# Patient Record
Sex: Female | Born: 1945 | Race: White | Hispanic: No | Smoking: Never smoker
Health system: Southern US, Community
[De-identification: ages and names within clinical notes are randomized; demographics above are authoritative.]

## PROBLEM LIST (undated history)

## (undated) DIAGNOSIS — Z8719 Personal history of other diseases of the digestive system: Secondary | ICD-10-CM

## (undated) DIAGNOSIS — C801 Malignant (primary) neoplasm, unspecified: Secondary | ICD-10-CM

## (undated) DIAGNOSIS — K219 Gastro-esophageal reflux disease without esophagitis: Secondary | ICD-10-CM

## (undated) DIAGNOSIS — F32A Depression, unspecified: Secondary | ICD-10-CM

## (undated) DIAGNOSIS — F419 Anxiety disorder, unspecified: Secondary | ICD-10-CM

## (undated) DIAGNOSIS — M1990 Unspecified osteoarthritis, unspecified site: Secondary | ICD-10-CM

## (undated) DIAGNOSIS — E785 Hyperlipidemia, unspecified: Secondary | ICD-10-CM

## (undated) DIAGNOSIS — F40240 Claustrophobia: Secondary | ICD-10-CM

## (undated) DIAGNOSIS — I10 Essential (primary) hypertension: Secondary | ICD-10-CM

## (undated) HISTORY — PX: HERNIA REPAIR: SHX51

## (undated) HISTORY — PX: COLONOSCOPY: SHX174

## (undated) HISTORY — PX: TONSILLECTOMY AND ADENOIDECTOMY: SHX28

## (undated) HISTORY — PX: COLON SURGERY: SHX602

## (undated) HISTORY — PX: TONSILLECTOMY: SUR1361

## (undated) SURGERY — Surgical Case
Anesthesia: *Unknown

---

## 2004-05-31 ENCOUNTER — Ambulatory Visit: Payer: Self-pay

## 2005-05-29 ENCOUNTER — Ambulatory Visit: Payer: Self-pay

## 2005-07-17 ENCOUNTER — Emergency Department: Payer: Self-pay | Admitting: Emergency Medicine

## 2006-07-03 ENCOUNTER — Ambulatory Visit: Payer: Self-pay

## 2009-03-11 HISTORY — PX: ABDOMINAL HYSTERECTOMY: SHX81

## 2010-03-11 HISTORY — PX: CHOLECYSTECTOMY: SHX55

## 2013-10-09 DIAGNOSIS — C20 Malignant neoplasm of rectum: Secondary | ICD-10-CM

## 2013-10-09 HISTORY — DX: Malignant neoplasm of rectum: C20

## 2013-10-14 DIAGNOSIS — R195 Other fecal abnormalities: Secondary | ICD-10-CM | POA: Insufficient documentation

## 2013-11-01 ENCOUNTER — Ambulatory Visit: Payer: Self-pay | Admitting: Gastroenterology

## 2013-11-01 DIAGNOSIS — K5790 Diverticulosis of intestine, part unspecified, without perforation or abscess without bleeding: Secondary | ICD-10-CM

## 2013-11-01 DIAGNOSIS — C801 Malignant (primary) neoplasm, unspecified: Secondary | ICD-10-CM

## 2013-11-01 DIAGNOSIS — K2960 Other gastritis without bleeding: Secondary | ICD-10-CM

## 2013-11-01 HISTORY — PX: ESOPHAGOGASTRODUODENOSCOPY: SHX1529

## 2013-11-01 HISTORY — DX: Other gastritis without bleeding: K29.60

## 2013-11-01 HISTORY — DX: Malignant (primary) neoplasm, unspecified: C80.1

## 2013-11-01 HISTORY — DX: Diverticulosis of intestine, part unspecified, without perforation or abscess without bleeding: K57.90

## 2013-11-03 LAB — PATHOLOGY REPORT

## 2013-11-09 HISTORY — PX: RECTAL ULTRASOUND: SHX2306

## 2013-11-18 DIAGNOSIS — C20 Malignant neoplasm of rectum: Secondary | ICD-10-CM | POA: Insufficient documentation

## 2013-12-28 HISTORY — PX: UMBILICAL HERNIA REPAIR: SHX196

## 2013-12-28 HISTORY — PX: PROCTECTOMY W/ CREATION OF COLON RESERVOIR: SUR1050

## 2014-01-14 ENCOUNTER — Emergency Department: Payer: Self-pay | Admitting: Emergency Medicine

## 2014-01-14 LAB — CBC WITH DIFFERENTIAL/PLATELET
BASOS ABS: 0.1 10*3/uL (ref 0.0–0.1)
BASOS PCT: 0.3 %
Eosinophil #: 0.1 10*3/uL (ref 0.0–0.7)
Eosinophil %: 0.4 %
HCT: 38.5 % (ref 35.0–47.0)
HGB: 13.2 g/dL (ref 12.0–16.0)
LYMPHS ABS: 1.4 10*3/uL (ref 1.0–3.6)
LYMPHS PCT: 8.1 %
MCH: 31.8 pg (ref 26.0–34.0)
MCHC: 34.3 g/dL (ref 32.0–36.0)
MCV: 93 fL (ref 80–100)
Monocyte #: 1.7 x10 3/mm — ABNORMAL HIGH (ref 0.2–0.9)
Monocyte %: 9.8 %
Neutrophil #: 14.5 10*3/uL — ABNORMAL HIGH (ref 1.4–6.5)
Neutrophil %: 81.4 %
Platelet: 542 10*3/uL — ABNORMAL HIGH (ref 150–440)
RBC: 4.15 10*6/uL (ref 3.80–5.20)
RDW: 12.9 % (ref 11.5–14.5)
WBC: 17.8 10*3/uL — ABNORMAL HIGH (ref 3.6–11.0)

## 2014-01-14 LAB — COMPREHENSIVE METABOLIC PANEL
ALBUMIN: 3 g/dL — AB (ref 3.4–5.0)
ANION GAP: 7 (ref 7–16)
AST: 18 U/L (ref 15–37)
Alkaline Phosphatase: 77 U/L
BILIRUBIN TOTAL: 0.4 mg/dL (ref 0.2–1.0)
BUN: 23 mg/dL — AB (ref 7–18)
Calcium, Total: 8.7 mg/dL (ref 8.5–10.1)
Chloride: 108 mmol/L — ABNORMAL HIGH (ref 98–107)
Co2: 25 mmol/L (ref 21–32)
Creatinine: 1.15 mg/dL (ref 0.60–1.30)
EGFR (Non-African Amer.): 50 — ABNORMAL LOW
GLUCOSE: 112 mg/dL — AB (ref 65–99)
Osmolality: 284 (ref 275–301)
POTASSIUM: 4 mmol/L (ref 3.5–5.1)
SGPT (ALT): 24 U/L
Sodium: 140 mmol/L (ref 136–145)
Total Protein: 7.7 g/dL (ref 6.4–8.2)

## 2014-01-14 LAB — URINALYSIS, COMPLETE
Bilirubin,UR: NEGATIVE
Blood: NEGATIVE
Glucose,UR: NEGATIVE mg/dL (ref 0–75)
Ketone: NEGATIVE
Leukocyte Esterase: NEGATIVE
NITRITE: NEGATIVE
PROTEIN: NEGATIVE
Ph: 5 (ref 4.5–8.0)
Specific Gravity: 1.021 (ref 1.003–1.030)
Squamous Epithelial: 10
WBC UR: 5 /HPF (ref 0–5)

## 2014-01-16 DIAGNOSIS — R188 Other ascites: Secondary | ICD-10-CM | POA: Insufficient documentation

## 2014-01-19 LAB — CULTURE, BLOOD (SINGLE)

## 2014-08-11 DIAGNOSIS — K594 Anal spasm: Secondary | ICD-10-CM | POA: Insufficient documentation

## 2014-09-13 DIAGNOSIS — K439 Ventral hernia without obstruction or gangrene: Secondary | ICD-10-CM | POA: Insufficient documentation

## 2015-04-05 ENCOUNTER — Other Ambulatory Visit: Payer: Self-pay | Admitting: Family Medicine

## 2015-04-05 DIAGNOSIS — Z1231 Encounter for screening mammogram for malignant neoplasm of breast: Secondary | ICD-10-CM

## 2015-05-08 HISTORY — PX: FLEXIBLE SIGMOIDOSCOPY: SHX1649

## 2016-08-01 ENCOUNTER — Other Ambulatory Visit: Payer: Self-pay | Admitting: Family Medicine

## 2016-08-01 DIAGNOSIS — Z1231 Encounter for screening mammogram for malignant neoplasm of breast: Secondary | ICD-10-CM

## 2016-08-16 ENCOUNTER — Encounter (HOSPITAL_COMMUNITY): Payer: Self-pay

## 2016-08-16 ENCOUNTER — Ambulatory Visit
Admission: RE | Admit: 2016-08-16 | Discharge: 2016-08-16 | Disposition: A | Discharge status: Home / Self Care | Payer: Medicare HMO | Source: Ambulatory Visit | Attending: Family Medicine | Admitting: Family Medicine

## 2016-08-16 DIAGNOSIS — Z1231 Encounter for screening mammogram for malignant neoplasm of breast: Secondary | ICD-10-CM | POA: Insufficient documentation

## 2016-08-16 HISTORY — DX: Malignant (primary) neoplasm, unspecified: C80.1

## 2017-07-07 ENCOUNTER — Other Ambulatory Visit: Payer: Self-pay | Admitting: Family Medicine

## 2017-07-07 DIAGNOSIS — Z1231 Encounter for screening mammogram for malignant neoplasm of breast: Secondary | ICD-10-CM

## 2017-09-02 ENCOUNTER — Ambulatory Visit
Admission: RE | Admit: 2017-09-02 | Discharge: 2017-09-02 | Disposition: A | Discharge status: Home / Self Care | Payer: Medicare HMO | Source: Ambulatory Visit | Attending: Family Medicine | Admitting: Family Medicine

## 2017-09-02 DIAGNOSIS — Z1231 Encounter for screening mammogram for malignant neoplasm of breast: Secondary | ICD-10-CM | POA: Diagnosis not present

## 2017-12-15 ENCOUNTER — Other Ambulatory Visit: Payer: Self-pay | Admitting: General Surgery

## 2017-12-15 DIAGNOSIS — Z9889 Other specified postprocedural states: Secondary | ICD-10-CM

## 2017-12-15 DIAGNOSIS — Z8719 Personal history of other diseases of the digestive system: Secondary | ICD-10-CM

## 2017-12-15 DIAGNOSIS — R109 Unspecified abdominal pain: Secondary | ICD-10-CM

## 2017-12-19 ENCOUNTER — Ambulatory Visit
Admission: RE | Admit: 2017-12-19 | Discharge: 2017-12-19 | Disposition: A | Discharge status: Home / Self Care | Payer: Medicare HMO | Source: Ambulatory Visit | Attending: General Surgery | Admitting: General Surgery

## 2017-12-24 ENCOUNTER — Other Ambulatory Visit
Admission: RE | Admit: 2017-12-24 | Discharge: 2017-12-24 | Disposition: A | Discharge status: Home / Self Care | Payer: Medicare HMO | Source: Ambulatory Visit | Attending: General Surgery | Admitting: General Surgery

## 2017-12-24 ENCOUNTER — Ambulatory Visit
Admission: RE | Admit: 2017-12-24 | Discharge: 2017-12-24 | Disposition: A | Discharge status: Home / Self Care | Payer: Medicare HMO | Source: Ambulatory Visit | Attending: General Surgery | Admitting: General Surgery

## 2017-12-24 DIAGNOSIS — Z9889 Other specified postprocedural states: Secondary | ICD-10-CM | POA: Insufficient documentation

## 2017-12-24 DIAGNOSIS — R109 Unspecified abdominal pain: Secondary | ICD-10-CM | POA: Insufficient documentation

## 2017-12-24 DIAGNOSIS — K6819 Other retroperitoneal abscess: Secondary | ICD-10-CM | POA: Diagnosis not present

## 2017-12-24 DIAGNOSIS — Z8719 Personal history of other diseases of the digestive system: Secondary | ICD-10-CM

## 2017-12-24 HISTORY — DX: Essential (primary) hypertension: I10

## 2017-12-24 LAB — CREATININE, SERUM: CREATININE: 0.89 mg/dL (ref 0.44–1.00)

## 2017-12-24 MED ORDER — IOPAMIDOL (ISOVUE-300) INJECTION 61%
100.0000 mL | Freq: Once | INTRAVENOUS | Status: AC | PRN
  Administered 2017-12-24: 100 mL via INTRAVENOUS

## 2019-06-09 ENCOUNTER — Other Ambulatory Visit: Payer: Self-pay | Admitting: Family Medicine

## 2019-06-09 DIAGNOSIS — Z1231 Encounter for screening mammogram for malignant neoplasm of breast: Secondary | ICD-10-CM

## 2019-06-09 DIAGNOSIS — Z780 Asymptomatic menopausal state: Secondary | ICD-10-CM

## 2019-07-20 ENCOUNTER — Ambulatory Visit
Admission: RE | Admit: 2019-07-20 | Discharge: 2019-07-20 | Disposition: A | Discharge status: Home / Self Care | Payer: Medicare HMO | Source: Ambulatory Visit | Attending: Family Medicine | Admitting: Family Medicine

## 2019-07-20 DIAGNOSIS — Z780 Asymptomatic menopausal state: Secondary | ICD-10-CM | POA: Insufficient documentation

## 2019-07-20 DIAGNOSIS — Z1231 Encounter for screening mammogram for malignant neoplasm of breast: Secondary | ICD-10-CM | POA: Diagnosis present

## 2019-11-11 ENCOUNTER — Ambulatory Visit (INDEPENDENT_AMBULATORY_CARE_PROVIDER_SITE_OTHER): Payer: Medicare HMO | Admitting: Vascular Surgery

## 2019-11-11 ENCOUNTER — Other Ambulatory Visit: Payer: Self-pay

## 2019-11-11 ENCOUNTER — Encounter (INDEPENDENT_AMBULATORY_CARE_PROVIDER_SITE_OTHER): Payer: Self-pay | Admitting: Vascular Surgery

## 2019-11-11 DIAGNOSIS — E782 Mixed hyperlipidemia: Secondary | ICD-10-CM

## 2019-11-11 DIAGNOSIS — I8390 Asymptomatic varicose veins of unspecified lower extremity: Secondary | ICD-10-CM | POA: Insufficient documentation

## 2019-11-11 DIAGNOSIS — I872 Venous insufficiency (chronic) (peripheral): Secondary | ICD-10-CM

## 2019-11-11 DIAGNOSIS — M79601 Pain in right arm: Secondary | ICD-10-CM | POA: Diagnosis not present

## 2019-11-11 DIAGNOSIS — E785 Hyperlipidemia, unspecified: Secondary | ICD-10-CM | POA: Insufficient documentation

## 2019-11-11 DIAGNOSIS — I8391 Asymptomatic varicose veins of right lower extremity: Secondary | ICD-10-CM | POA: Diagnosis not present

## 2019-11-11 NOTE — Progress Notes (Signed)
MRN : [id number]  Destiny Durham is a 74 y.o. (30-Jul-1945) female who presents with chief complaint of  Chief Complaint  Patient presents with  . New Patient (Initial Visit)    ref Bliss pain with upper right arm  .  History of Present Illness:   Chief complaint: Right arm/shoulder pain  Location: right shoulder Character/quality of the symptom:  Sharp point specific pain Severity:  intense Duration:  intermittent Timing/onset:  Acute while she was moving heavy furniture Aggravating/context:  Raising her arm; with movement Relieving/modifying:  Rest cold compresses   Current Meds  Medication Sig  . diazepam (VALIUM) 10 MG tablet   . gabapentin (NEURONTIN) 300 MG capsule Take by mouth.  Marland Kitchen lisinopril-hydrochlorothiazide (ZESTORETIC) 20-12.5 MG tablet   . Multiple Vitamins-Minerals (ONE-A-DAY VITACRAVES) CHEW daily.  . naproxen sodium (ALEVE) 220 MG tablet Take by mouth.  . pantoprazole (PROTONIX) 40 MG tablet Take by mouth.  . pravastatin (PRAVACHOL) 20 MG tablet   . sertraline (ZOLOFT) 50 MG tablet Take by mouth.    Past Medical History:  Diagnosis Date  . Cancer ([place])    rectal  . Hypertension     Past Surgical History:  Procedure Laterality Date  . ABDOMINAL HYSTERECTOMY    . CHOLECYSTECTOMY    . COLON SURGERY    . TONSILLECTOMY AND ADENOIDECTOMY      Social History Social History   Tobacco Use  . Smoking status: Never Smoker  . Smokeless tobacco: Never Used  Substance Use Topics  . Alcohol use: Not Currently  . Drug use: Never    Family History Family History  Problem Relation Age of Onset  . Hypertension Mother   . Kidney cancer Sister   . Heart attack Maternal Grandmother   . Breast cancer Neg Hx   No family history of bleeding/clotting disorders, porphyria or autoimmune disease   Allergies  Allergen Reactions  . Penicillins Hives  . Latex Rash     REVIEW OF SYSTEMS (Negative unless checked)  Constitutional: [] Weight loss   [] Fever  [] Chills Cardiac: [] Chest pain   [] Chest pressure   [] Palpitations   [] Shortness of breath when laying flat   [] Shortness of breath with exertion. Vascular:  [] Pain in legs with walking   [] Pain in legs at rest  [] History of DVT   [] Phlebitis   [] Swelling in legs   [] Varicose veins   [] Non-healing ulcers Pulmonary:   [] Uses home oxygen   [] Productive cough   [] Hemoptysis   [] Wheeze  [] COPD   [] Asthma Neurologic:  [] Dizziness   [] Seizures   [] History of stroke   [] History of TIA  [] Aphasia   [] Vissual changes   [] Weakness or numbness in arm   [] Weakness or numbness in leg Musculoskeletal:   [] Joint swelling   [x] Joint pain   [] Low back pain Hematologic:  [] Easy bruising  [] Easy bleeding   [] Hypercoagulable state   [] Anemic Gastrointestinal:  [] Diarrhea   [] Vomiting  [] Gastroesophageal reflux/heartburn   [] Difficulty swallowing. Genitourinary:  [] Chronic kidney disease   [] Difficult urination  [] Frequent urination   [] Blood in urine Skin:  [] Rashes   [] Ulcers  Psychological:  [] History of anxiety   []  History of major depression.  Physical Examination  Vitals:   11/11/19 1320  BP: 122/71  Pulse: 73  Resp: 16  Weight: 183 lb (83 kg)  Height: 5\' 3"  (1.6 m)   Body mass index is 32.42 kg/m. Gen: WD/WN, NAD Head: [place]/AT, No temporalis wasting.  Ear/Nose/Throat: Hearing grossly intact, nares w/o erythema  or drainage, poor dentition Eyes: PER, EOMI, sclera nonicteric.  Neck: Supple, no masses.  No bruit or JVD.  Pulmonary:  Good air movement, clear to auscultation bilaterally, no use of accessory muscles.  Cardiac: RRR, normal S1, S2, no Murmurs. Vascular: Large varicosities present extensively greater than 10 mm right calf.  Mild venous stasis changes to the legs bilaterally.  trace soft pitting edema Vessel Right Left  Radial Palpable Palpable  Brachial Palpable Palpable  PT Palpable   DP Palpable   Gastrointestinal: soft, non-distended. No guarding/no peritoneal signs.   Musculoskeletal: M/S 5/5 throughout.  No deformity or atrophy.  Neurologic: CN 2-12 intact. Pain and light touch intact in extremities.  Symmetrical.  Speech is fluent. Motor exam as listed above. Psychiatric: Judgment intact, Mood & affect appropriate for pt's clinical situation. Dermatologic: No rashes or ulcers noted.  No changes consistent with cellulitis.  CBC Lab Results  Component Value Date   WBC 17.8 (H) 01/14/2014   HGB 13.2 01/14/2014   HCT 38.5 01/14/2014   MCV 93 01/14/2014   PLT 542 (H) 01/14/2014    BMET    Component Value Date/Time   NA 140 01/14/2014 1730   K 4.0 01/14/2014 1730   CL 108 (H) 01/14/2014 1730   CO2 25 01/14/2014 1730   GLUCOSE 112 (H) 01/14/2014 1730   BUN 23 (H) 01/14/2014 1730   CREATININE 0.89 12/24/2017 1121   CREATININE 1.15 01/14/2014 1730   CALCIUM 8.7 01/14/2014 1730   GFRNONAA >60 12/24/2017 1121   GFRNONAA 50 (L) 01/14/2014 1730   GFRAA >60 12/24/2017 1121   GFRAA >60 01/14/2014 1730   CrCl cannot be calculated (Patient's most recent lab result is older than the maximum 21 days allowed.).  COAG No results found for: INR, PROTIME  Radiology No results found.    Assessment/Plan 1. Right arm pain I suspect the patient has a shoulder injury from moving heavy furniture.  - Ambulatory referral to Orthopedic Surgery  2. Varicose veins of right lower extremity, unspecified whether complicated Recommend:  The patient is complaining of varicose veins.    I have had a long discussion with the patient regarding  varicose veins and why they cause symptoms.  Patient will begin wearing graduated compression stockings on a daily basis, beginning first thing in the morning and removing them in the evening. The patient is instructed specifically not to sleep in the stockings.    In addition, behavioral modification including elevation during the day will be initiated, utilizing a recliner was recommended.  The patient is also instructed  to continue exercising such as walking 4-5 times per week.  At this time the patient wishes to continue conservative therapy and is not interested in more invasive treatments such as laser ablation and sclerotherapy.  The Patient will follow up PRN if the symptoms worsen.  3. Chronic venous insufficiency Recommend:  The patient is complaining of varicose veins.    I have had a long discussion with the patient regarding  varicose veins and why they cause symptoms.  Patient will begin wearing graduated compression stockings on a daily basis, beginning first thing in the morning and removing them in the evening. The patient is instructed specifically not to sleep in the stockings.    In addition, behavioral modification including elevation during the day will be initiated, utilizing a recliner was recommended.  The patient is also instructed to continue exercising such as walking 4-5 times per week.  At this time the patient wishes to  continue conservative therapy and is not interested in more invasive treatments such as laser ablation and sclerotherapy.  The Patient will follow up PRN if the symptoms worsen.  4. Mixed hyperlipidemia Continue statin as ordered and reviewed, no changes at this time     Hortencia Pilar, MD  11/11/2019 2:13 PM

## 2019-11-29 ENCOUNTER — Other Ambulatory Visit: Payer: Self-pay | Admitting: Surgery

## 2019-11-29 ENCOUNTER — Other Ambulatory Visit (HOSPITAL_COMMUNITY): Payer: Self-pay | Admitting: Surgery

## 2019-11-29 DIAGNOSIS — M7581 Other shoulder lesions, right shoulder: Secondary | ICD-10-CM

## 2019-12-16 ENCOUNTER — Other Ambulatory Visit: Payer: Self-pay

## 2019-12-16 ENCOUNTER — Ambulatory Visit
Admission: RE | Admit: 2019-12-16 | Discharge: 2019-12-16 | Disposition: A | Discharge status: Home / Self Care | Payer: Medicare HMO | Source: Ambulatory Visit | Attending: Surgery | Admitting: Surgery

## 2019-12-16 DIAGNOSIS — M7581 Other shoulder lesions, right shoulder: Secondary | ICD-10-CM | POA: Insufficient documentation

## 2020-01-14 ENCOUNTER — Other Ambulatory Visit: Payer: Self-pay | Admitting: Surgery

## 2020-01-31 ENCOUNTER — Other Ambulatory Visit: Payer: Self-pay

## 2020-01-31 ENCOUNTER — Encounter
Admission: RE | Admit: 2020-01-31 | Discharge: 2020-01-31 | Disposition: A | Discharge status: Home / Self Care | Payer: Medicare HMO | Source: Ambulatory Visit | Attending: Surgery | Admitting: Surgery

## 2020-01-31 DIAGNOSIS — I10 Essential (primary) hypertension: Secondary | ICD-10-CM | POA: Diagnosis not present

## 2020-01-31 DIAGNOSIS — Z01818 Encounter for other preprocedural examination: Secondary | ICD-10-CM | POA: Diagnosis not present

## 2020-01-31 HISTORY — DX: Gastro-esophageal reflux disease without esophagitis: K21.9

## 2020-01-31 HISTORY — DX: Hyperlipidemia, unspecified: E78.5

## 2020-01-31 HISTORY — DX: Anxiety disorder, unspecified: F41.9

## 2020-01-31 HISTORY — DX: Personal history of other diseases of the digestive system: Z87.19

## 2020-01-31 HISTORY — DX: Depression, unspecified: F32.A

## 2020-01-31 LAB — CBC
HCT: 42.4 % (ref 36.0–46.0)
Hemoglobin: 14.5 g/dL (ref 12.0–15.0)
MCH: 32 pg (ref 26.0–34.0)
MCHC: 34.2 g/dL (ref 30.0–36.0)
MCV: 93.6 fL (ref 80.0–100.0)
Platelets: 244 10*3/uL (ref 150–400)
RBC: 4.53 MIL/uL (ref 3.87–5.11)
RDW: 12.6 % (ref 11.5–15.5)
WBC: 7.3 10*3/uL (ref 4.0–10.5)
nRBC: 0 % (ref 0.0–0.2)

## 2020-01-31 LAB — BASIC METABOLIC PANEL
Anion gap: 4 — ABNORMAL LOW (ref 5–15)
BUN: 16 mg/dL (ref 8–23)
CO2: 28 mmol/L (ref 22–32)
Calcium: 8.7 mg/dL — ABNORMAL LOW (ref 8.9–10.3)
Chloride: 105 mmol/L (ref 98–111)
Creatinine, Ser: 0.91 mg/dL (ref 0.44–1.00)
GFR, Estimated: >60 mL/min (ref >60)
Glucose, Bld: 100 mg/dL — ABNORMAL HIGH (ref 70–99)
Potassium: 3.2 mmol/L — ABNORMAL LOW (ref 3.5–5.1)
Sodium: 137 mmol/L (ref 135–145)

## 2020-01-31 NOTE — Patient Instructions (Addendum)
Your procedure is scheduled on: Tuesday, November 30 Report to the Registration Desk on the 1st floor of the [facility]. To find out your arrival time, please call [phone] between 1PM - 3PM on: Monday, November 29  REMEMBER: Instructions that are not followed completely may result in serious medical risk, up to and including death; or upon the discretion of your surgeon and anesthesiologist your surgery may need to be rescheduled.  Do not eat food after midnight the night before surgery.  No gum chewing, lozengers or hard candies.  You may however, drink CLEAR liquids up to 2 hours before you are scheduled to arrive for your surgery. Do not drink anything within 2 hours of your scheduled arrival time.  Clear liquids include: - water  - apple juice without pulp - gatorade (not RED, PURPLE, OR BLUE) - black coffee or tea (Do NOT add milk or creamers to the coffee or tea) Do NOT drink anything that is not on this list.  In addition, your doctor has ordered for you to drink the provided  Ensure Pre-Surgery Clear Carbohydrate Drink  Drinking this carbohydrate drink up to two hours before surgery helps to reduce insulin resistance and improve patient outcomes. Please complete drinking 2 hours prior to scheduled arrival time.  TAKE THESE MEDICATIONS THE MORNING OF SURGERY WITH A SIP OF WATER:  1.  Gabapentin 2.  Pantoprazole (Protonix) - (take one the night before and one on the morning of surgery - helps to prevent nausea after surgery.)  One week prior to surgery: Starting November 23 Stop Anti-inflammatories (NSAIDS) such as Advil, Aleve, Ibuprofen, Motrin, Naproxen, Naprosyn and Aspirin based products such as Excedrin, Goodys Powder, BC Powder. Stop ANY OVER THE COUNTER supplements until after surgery. (However, you may continue taking multivitamin up until the day before surgery.)  No Alcohol for 24 hours before or after surgery.  On the morning of surgery brush your teeth  with toothpaste and water, you may rinse your mouth with mouthwash if you wish. Do not swallow any toothpaste or mouthwash.  Do not wear jewelry, make-up, hairpins, clips or nail polish.  Do not wear lotions, powders, or perfumes.   Do not shave body from the neck down 48 hours prior to surgery just in case you cut yourself which could leave a site for infection.  Also, freshly shaved skin may become irritated if using the CHG soap.  Dentures may not be worn into surgery.  Do not bring valuables to the hospital. [facility] is not responsible for any missing/lost belongings or valuables.   Use CHG Soap as directed on instruction sheet.  Notify your doctor if there is any change in your medical condition (cold, fever, infection).  Wear comfortable clothing (specific to your surgery type) to the hospital.  Plan for stool softeners for home use; pain medications have a tendency to cause constipation. You can also help prevent constipation by eating foods high in fiber such as fruits and vegetables and drinking plenty of fluids as your diet allows.  After surgery, you can help prevent lung complications by doing breathing exercises.  Take deep breaths and cough every 1-2 hours. Your doctor may order a device called an Incentive Spirometer to help you take deep breaths.  If you are being discharged the day of surgery, you will not be allowed to drive home. You will need a responsible adult (18 years or older) to drive you home and stay with you that night.   If you  are taking public transportation, you will need to have a responsible adult (18 years or older) with you. Please confirm with your physician that it is acceptable to use public transportation.   Please call the [facility] at [phone] if you have any questions about these instructions.  Visitation Policy:  Patients undergoing a surgery or procedure may have one family member or support person with them  as long as that person is not COVID-19 positive or experiencing its symptoms.  That person may remain in the waiting area during the procedure.

## 2020-01-31 NOTE — Progress Notes (Signed)
[facility] Perioperative Services: Pre-Admission/Anesthesia Testing   Date: 01/31/20 Name: Destiny Durham MRN:   [id number]  Re: Consideration of preoperative prophylactic antibiotic change   Request sent to: Poggi, Marshall Cork, MD (routed and/or faxed via [facility])  Planned Surgical Procedure(s):    Case: [id number] Date/Time: 02/08/20 1328   Procedure: SHOULDER ARTHROSCOPY WITH DEBRIDEMENT, DECOMPRESSION, AND ROTATOR CUFF REPAIR, AND BICEP TENDON REPAIR (Right Shoulder)   Anesthesia type: Choice   Pre-op diagnosis:      Rotator cuff tendinitis, right M75.81     Right shoulder pain, unspecified chronicity M25.511   Location: [facility] OR ROOM 03 / [place] [facility] FOR ANESTHESIA GROUP   Surgeons: Corky Mull, MD    Notes: 1. Patient has a documented allergy to PCN  . Advising that PCN has caused her to experience urticarial rash in the past.   2. Screened as appropriate for cephalosporin use during medication reconciliation . No immediate angioedema, dysphagia, SOB, anaphylaxis symptoms. . No severe rash involving mucous membranes or skin necrosis. . No hospital admissions related to side effects of PCN/cephalosporin use.  . No documented reaction to PCN or cephalosporin in the last 10 years.  Request:  As an evidence based approach to reducing the rate of incidence for post-operative SSI and the development of MDROs, could an agent with narrower coverage for preoperative prophylaxis in this patient's upcoming surgical course be considered?  1. Currently ordered preoperative prophylactic ABX: clindamycin.   2. Specifically requesting change to cephalosporin (CEFAZOLIN).   3. Please communicate decision with me and I will change the orders in Epic as per your direction.   Things to consider:  Many patients report that they were "allergic" to PCN earlier in life, however this does not translate into a true lifelong allergy. Patients can lose sensitivity to specific IgE  antibodies over time if PCN is avoided (Kleris & Lugar, 2019).   Up to 10% of the adult population and 15% of hospitalized patients report an allergy to PCN, however clinical studies suggest that 90% of those reporting an allergy can tolerate PCN antibiotics (Kleris & Lugar, 2019).   Cross-sensitivity between PCN and cephalosporins has been documented as being as high as 10%, however this estimation included data believed to have been collected in a setting where there was contamination. Newer data suggests that the prevalence of cross-sensitivity between PCN and cephalosporins is actually estimated to be closer to 1% (Hermanides et al., 2018).    Patients labeled as PCN allergic, whether they are truly allergic or not, have been found to have inferior outcomes in terms of rates of serious infection, and these patients tend to have longer hospital stays (Harrell, 2019).   Treatment related secondary infections, such as Clostridioides difficile, have been linked to the improper use of broad spectrum antibiotics in patients improperly labeled as PCN allergic (Kleris & Lugar, 2019).   Anaphylaxis from cephalosporins is rare and the evidence suggests that there is no increased risk of an anaphylactic type reaction when cephalosporins are used in a PCN allergic patient (Pichichero, 2006).  Citations: Hermanides J, Lemkes BA, Prins Pearla Dubonnet MW, Terreehorst I. Presumed ?-Lactam Allergy and Cross-reactivity in the Operating Theater: A Practical Approach. Anesthesiology. 2018 Aug;129(2):335-342. doi: 10.1097/ALN.0000000000002252. PMID: 98338250.  Kleris, Lula., & Lugar, P. L. (2019). Things We Do For No Reason: Failing to Question a Penicillin Allergy History. Journal of hospital medicine, 14(10), [phone]. Advance online publication. https://www.wallace-middleton.info/  Pichichero, M. E. (2006). Cephalosporins can be prescribed  safely for penicillin-allergic patients. Journal of family medicine,  55(2), 106-112. Accessed: https://cdn.mdedge.com/files/s45fs-public/Document/September-2017/5502JFP_AppliedEvidence1.pdf   Honor Loh, MSN, APRN, FNP-C, CEN [facility]  Peri-operative Services Nurse Practitioner 01/31/20 4:04 PM

## 2020-02-01 NOTE — Progress Notes (Signed)
  [facility] Perioperative Services: Pre-Admission/Anesthesia Testing     Date: 02/01/20  Name: Millenia Waldvogel MRN:   [id number]  Re: Change in [place] for upcoming surgery   Case: [id number] Date/Time: 02/08/20 1328   Procedure: SHOULDER ARTHROSCOPY WITH DEBRIDEMENT, DECOMPRESSION, AND ROTATOR CUFF REPAIR, AND BICEP TENDON REPAIR (Right Shoulder)   Anesthesia type: Choice   Pre-op diagnosis:      Rotator cuff tendinitis, right M75.81     Right shoulder pain, unspecified chronicity M25.511   Location: [facility] OR ROOM 03 / [place] [facility]S FOR ANESTHESIA GROUP   Surgeons: Corky Mull, MD    Primary attending surgeon was consulted regarding consideration of therapeutic change in antimicrobial agent being used for preoperative prophylaxis in this patient's upcoming surgical case. Following analysis of the risk versus benefits, Dr. Roland Rack, Marshall Cork, MD advising that it would be acceptable to discontinue the ordered clindamycin and place an order for cefazolin 2 gm IV on call to the OR. Orders for this patient were amended by me following collaborative conversation with attending surgeon.  Honor Loh, MSN, APRN, FNP-C, CEN [facility]  Peri-operative Services Nurse Practitioner Phone: [phone] 02/01/20 4:30 PM

## 2020-02-04 ENCOUNTER — Other Ambulatory Visit
Admission: RE | Admit: 2020-02-04 | Discharge: 2020-02-04 | Disposition: A | Discharge status: Home / Self Care | Payer: Medicare HMO | Source: Ambulatory Visit | Attending: Surgery | Admitting: Surgery

## 2020-02-04 ENCOUNTER — Other Ambulatory Visit: Payer: Self-pay

## 2020-02-04 DIAGNOSIS — Z01818 Encounter for other preprocedural examination: Secondary | ICD-10-CM | POA: Insufficient documentation

## 2020-02-04 DIAGNOSIS — Z20822 Contact with and (suspected) exposure to covid-19: Secondary | ICD-10-CM | POA: Diagnosis not present

## 2020-02-05 LAB — SARS CORONAVIRUS 2 (TAT 6-24 HRS): SARS Coronavirus 2: NEGATIVE

## 2020-02-07 MED ORDER — ORAL CARE MOUTH RINSE: 15.0000 mL | Freq: Once | OROMUCOSAL | Status: AC

## 2020-02-07 MED ORDER — LACTATED RINGERS IV SOLN: INTRAVENOUS | Status: DC

## 2020-02-07 MED ORDER — CHLORHEXIDINE GLUCONATE 0.12 % MT SOLN: 15.0000 mL | Freq: Once | OROMUCOSAL | Status: AC

## 2020-02-07 MED ORDER — CEFAZOLIN SODIUM-DEXTROSE 2-4 GM/100ML-% IV SOLN
2.0000 g | Freq: Once | INTRAVENOUS | Status: AC
  Administered 2020-02-08: 2 g via INTRAVENOUS

## 2020-02-08 ENCOUNTER — Encounter
Admission: RE | Disposition: A | Discharge status: Home / Self Care | Payer: Self-pay | Source: Home / Self Care | Attending: Surgery

## 2020-02-08 ENCOUNTER — Encounter: Payer: Self-pay | Admitting: Surgery

## 2020-02-08 ENCOUNTER — Ambulatory Visit
Admission: RE | Admit: 2020-02-08 | Discharge: 2020-02-08 | Disposition: A | Discharge status: Home / Self Care | Payer: Medicare HMO | Attending: Surgery | Admitting: Surgery

## 2020-02-08 ENCOUNTER — Ambulatory Visit: Payer: Medicare HMO

## 2020-02-08 ENCOUNTER — Ambulatory Visit: Payer: Medicare HMO | Admitting: Urgent Care

## 2020-02-08 ENCOUNTER — Other Ambulatory Visit: Payer: Self-pay

## 2020-02-08 DIAGNOSIS — M75111 Incomplete rotator cuff tear or rupture of right shoulder, not specified as traumatic: Secondary | ICD-10-CM | POA: Insufficient documentation

## 2020-02-08 DIAGNOSIS — Z419 Encounter for procedure for purposes other than remedying health state, unspecified: Secondary | ICD-10-CM

## 2020-02-08 DIAGNOSIS — Z880 Allergy status to penicillin: Secondary | ICD-10-CM | POA: Diagnosis not present

## 2020-02-08 DIAGNOSIS — Z79899 Other long term (current) drug therapy: Secondary | ICD-10-CM | POA: Insufficient documentation

## 2020-02-08 DIAGNOSIS — M75121 Complete rotator cuff tear or rupture of right shoulder, not specified as traumatic: Secondary | ICD-10-CM | POA: Diagnosis present

## 2020-02-08 DIAGNOSIS — M7581 Other shoulder lesions, right shoulder: Secondary | ICD-10-CM | POA: Diagnosis not present

## 2020-02-08 HISTORY — PX: SHOULDER ARTHROSCOPY WITH SUBACROMIAL DECOMPRESSION AND OPEN ROTATOR C: SHX5688

## 2020-02-08 SURGERY — SHOULDER ARTHROSCOPY WITH SUBACROMIAL DECOMPRESSION AND OPEN ROTATOR CUFF REPAIR, OPEN BICEPS TENDON REPAIR
Anesthesia: General | Site: Shoulder | Laterality: Right

## 2020-02-08 MED ORDER — ONDANSETRON HCL 4 MG/2ML IJ SOLN: 4.0000 mg | Freq: Once | INTRAMUSCULAR | Status: DC | PRN

## 2020-02-08 MED ORDER — OXYCODONE HCL 5 MG PO TABS: 5.0000 mg | ORAL_TABLET | Freq: Once | ORAL | Status: DC | PRN

## 2020-02-08 MED ORDER — FENTANYL CITRATE (PF) 100 MCG/2ML IJ SOLN
INTRAMUSCULAR | Status: AC
  Filled 2020-02-08: qty 2

## 2020-02-08 MED ORDER — BUPIVACAINE LIPOSOME 1.3 % IJ SUSP
INTRAMUSCULAR | Status: AC
  Filled 2020-02-08: qty 20

## 2020-02-08 MED ORDER — ROCURONIUM BROMIDE 100 MG/10ML IV SOLN
INTRAVENOUS | Status: DC | PRN
  Administered 2020-02-08: 20 mg via INTRAVENOUS
  Administered 2020-02-08: 60 mg via INTRAVENOUS

## 2020-02-08 MED ORDER — LIDOCAINE HCL (PF) 1 % IJ SOLN
INTRAMUSCULAR | Status: AC
  Filled 2020-02-08: qty 5

## 2020-02-08 MED ORDER — HYDROMORPHONE HCL 1 MG/ML IJ SOLN
INTRAMUSCULAR | Status: AC
  Filled 2020-02-08: qty 1

## 2020-02-08 MED ORDER — HYDROMORPHONE HCL 1 MG/ML IJ SOLN
0.5000 mg | INTRAMUSCULAR | Status: DC | PRN
  Administered 2020-02-08 (×2): 0.5 mg via INTRAVENOUS

## 2020-02-08 MED ORDER — BUPIVACAINE HCL (PF) 0.5 % IJ SOLN
INTRAMUSCULAR | Status: AC
  Filled 2020-02-08: qty 10

## 2020-02-08 MED ORDER — BUPIVACAINE-EPINEPHRINE (PF) 0.5% -1:200000 IJ SOLN
INTRAMUSCULAR | Status: AC
  Filled 2020-02-08: qty 30

## 2020-02-08 MED ORDER — CHLORHEXIDINE GLUCONATE 0.12 % MT SOLN
OROMUCOSAL | Status: AC
  Administered 2020-02-08: 15 mL via OROMUCOSAL
  Filled 2020-02-08: qty 15

## 2020-02-08 MED ORDER — HYDROCODONE-ACETAMINOPHEN 5-325 MG PO TABS
1.0000 | ORAL_TABLET | Freq: Four times a day (QID) | ORAL | Status: DC | PRN
  Administered 2020-02-08: 2 via ORAL

## 2020-02-08 MED ORDER — ONDANSETRON HCL 4 MG/2ML IJ SOLN
INTRAMUSCULAR | Status: DC | PRN
  Administered 2020-02-08: 4 mg via INTRAVENOUS

## 2020-02-08 MED ORDER — EPINEPHRINE PF 1 MG/ML IJ SOLN
INTRAMUSCULAR | Status: AC
  Filled 2020-02-08: qty 2

## 2020-02-08 MED ORDER — FENTANYL CITRATE (PF) 100 MCG/2ML IJ SOLN
INTRAMUSCULAR | Status: AC
  Administered 2020-02-08: 25 ug via INTRAVENOUS
  Filled 2020-02-08: qty 2

## 2020-02-08 MED ORDER — PHENYLEPHRINE HCL (PRESSORS) 10 MG/ML IV SOLN
INTRAVENOUS | Status: DC | PRN
  Administered 2020-02-08: 50 ug via INTRAVENOUS
  Administered 2020-02-08 (×4): 100 ug via INTRAVENOUS
  Administered 2020-02-08: 50 ug via INTRAVENOUS

## 2020-02-08 MED ORDER — CEFAZOLIN SODIUM-DEXTROSE 2-4 GM/100ML-% IV SOLN
INTRAVENOUS | Status: AC
  Filled 2020-02-08: qty 100

## 2020-02-08 MED ORDER — LIDOCAINE HCL (CARDIAC) PF 100 MG/5ML IV SOSY
PREFILLED_SYRINGE | INTRAVENOUS | Status: DC | PRN
  Administered 2020-02-08: 50 mg via INTRAVENOUS

## 2020-02-08 MED ORDER — HYDROCODONE-ACETAMINOPHEN 5-325 MG PO TABS
ORAL_TABLET | ORAL | Status: AC
  Filled 2020-02-08: qty 2

## 2020-02-08 MED ORDER — DEXAMETHASONE SODIUM PHOSPHATE 10 MG/ML IJ SOLN
INTRAMUSCULAR | Status: DC | PRN
  Administered 2020-02-08: 5 mg via INTRAVENOUS

## 2020-02-08 MED ORDER — FENTANYL CITRATE (PF) 100 MCG/2ML IJ SOLN: 50.0000 ug | Freq: Once | INTRAMUSCULAR | Status: AC

## 2020-02-08 MED ORDER — BUPIVACAINE-EPINEPHRINE 0.5% -1:200000 IJ SOLN
INTRAMUSCULAR | Status: DC | PRN
  Administered 2020-02-08: 30 mL

## 2020-02-08 MED ORDER — ACETAMINOPHEN 10 MG/ML IV SOLN
INTRAVENOUS | Status: AC
  Filled 2020-02-08: qty 100

## 2020-02-08 MED ORDER — HYDROCODONE-ACETAMINOPHEN 5-325 MG PO TABS
1.0000 | ORAL_TABLET | Freq: Four times a day (QID) | ORAL | 0 refills | Status: DC | PRN
Start: 2020-02-08 — End: 2021-07-03

## 2020-02-08 MED ORDER — FENTANYL CITRATE (PF) 100 MCG/2ML IJ SOLN
25.0000 ug | INTRAMUSCULAR | Status: DC | PRN
  Administered 2020-02-08 (×3): 50 ug via INTRAVENOUS

## 2020-02-08 MED ORDER — SUGAMMADEX SODIUM 200 MG/2ML IV SOLN
INTRAVENOUS | Status: DC | PRN
  Administered 2020-02-08: 200 mg via INTRAVENOUS

## 2020-02-08 MED ORDER — ACETAMINOPHEN 10 MG/ML IV SOLN
INTRAVENOUS | Status: DC | PRN
  Administered 2020-02-08: 1000 mg via INTRAVENOUS

## 2020-02-08 MED ORDER — PROPOFOL 10 MG/ML IV BOLUS
INTRAVENOUS | Status: AC
  Filled 2020-02-08: qty 40

## 2020-02-08 MED ORDER — OXYCODONE HCL 5 MG/5ML PO SOLN: 5.0000 mg | Freq: Once | ORAL | Status: DC | PRN

## 2020-02-08 MED ORDER — PROPOFOL 10 MG/ML IV BOLUS
INTRAVENOUS | Status: DC | PRN
  Administered 2020-02-08: 100 mg via INTRAVENOUS
  Administered 2020-02-08: 50 mg via INTRAVENOUS

## 2020-02-08 SURGICAL SUPPLY — 60 items
ANCH SUT 2 2.9 2 LD TPR NDL (Anchor) ×1 IMPLANT
ANCH SUT 5.5 KNTLS (Anchor) ×2 IMPLANT
ANCH SUT BN ASCP DLV (Anchor) ×1 IMPLANT
ANCH SUT Q-FX 2.8 (Anchor) ×2 IMPLANT
ANCH SUT RGNRT REGENETEN (Staple) ×1 IMPLANT
ANCHOR ALL-SUT Q-FIX 2.8 (Anchor) ×6 IMPLANT
ANCHOR BONE REGENETEN (Anchor) ×3 IMPLANT
ANCHOR HEALICOIL REGEN 5.5 (Anchor) ×6 IMPLANT
ANCHOR JUGGERKNOT WTAP NDL 2.9 (Anchor) ×3 IMPLANT
ANCHOR TENDON REGENETEN (Staple) ×3 IMPLANT
APL PRP STRL LF DISP 70% ISPRP (MISCELLANEOUS) ×1
BIT DRILL JUGRKNT W/NDL BIT2.9 (DRILL) ×1 IMPLANT
BLADE FULL RADIUS 3.5 (BLADE) ×3 IMPLANT
BUR ACROMIONIZER 4.0 (BURR) ×3 IMPLANT
CANNULA SHAVER 8MMX76MM (CANNULA) ×3 IMPLANT
CHLORAPREP W/TINT 26 (MISCELLANEOUS) ×3 IMPLANT
COVER MAYO STAND REUSABLE (DRAPES) ×3 IMPLANT
COVER WAND RF STERILE (DRAPES) ×3 IMPLANT
DILATOR 5.5 THREADED HEALICOIL (MISCELLANEOUS) ×3 IMPLANT
DRAPE IMP U-DRAPE 54X76 (DRAPES) ×6 IMPLANT
DRILL JUGGERKNOT W/NDL BIT 2.9 (DRILL) ×3
ELECT CAUTERY BLADE TIP 2.5 (TIP) ×3
ELECT REM PT RETURN 9FT ADLT (ELECTROSURGICAL) ×3
ELECTRODE CAUTERY BLDE TIP 2.5 (TIP) ×1 IMPLANT
ELECTRODE REM PT RTRN 9FT ADLT (ELECTROSURGICAL) ×1 IMPLANT
GAUZE SPONGE 4X4 12PLY STRL (GAUZE/BANDAGES/DRESSINGS) ×3 IMPLANT
GAUZE XEROFORM 1X8 LF (GAUZE/BANDAGES/DRESSINGS) ×3 IMPLANT
GLOVE BIO SURGEON STRL SZ7.5 (GLOVE) ×6 IMPLANT
GLOVE BIO SURGEON STRL SZ8 (GLOVE) ×6 IMPLANT
GLOVE BIOGEL PI IND STRL 8 (GLOVE) ×1 IMPLANT
GLOVE BIOGEL PI INDICATOR 8 (GLOVE) ×2
GLOVE INDICATOR 8.0 STRL GRN (GLOVE) ×3 IMPLANT
GOWN STRL REUS W/ TWL LRG LVL3 (GOWN DISPOSABLE) ×1 IMPLANT
GOWN STRL REUS W/ TWL XL LVL3 (GOWN DISPOSABLE) ×1 IMPLANT
GOWN STRL REUS W/TWL LRG LVL3 (GOWN DISPOSABLE) ×3
GOWN STRL REUS W/TWL XL LVL3 (GOWN DISPOSABLE) ×3
GRASPER SUT 15 45D LOW PRO (SUTURE) IMPLANT
IMPL REGENETEN MEDIUM (Shoulder) ×1 IMPLANT
IMPLANT REGENETEN MEDIUM (Shoulder) ×3 IMPLANT
IV LACTATED RINGER IRRG 3000ML (IV SOLUTION) ×6
IV LR IRRIG 3000ML ARTHROMATIC (IV SOLUTION) ×2 IMPLANT
KIT CANNULA 8X76-LX IN CANNULA (CANNULA) ×3 IMPLANT
KIT SUTURE 2.8 Q-FIX DISP (MISCELLANEOUS) ×3 IMPLANT
MANIFOLD NEPTUNE II (INSTRUMENTS) ×6 IMPLANT
MASK FACE SPIDER DISP (MASK) ×3 IMPLANT
MAT ABSORB  FLUID 56X50 GRAY (MISCELLANEOUS) ×2
MAT ABSORB FLUID 56X50 GRAY (MISCELLANEOUS) ×1 IMPLANT
PACK ARTHROSCOPY SHOULDER (MISCELLANEOUS) ×3 IMPLANT
PASSER SUT FIRSTPASS SELF (INSTRUMENTS) ×3 IMPLANT
SLING ARM LRG DEEP (SOFTGOODS) ×3 IMPLANT
SLING ULTRA II LG (MISCELLANEOUS) ×3 IMPLANT
STAPLER SKIN PROX 35W (STAPLE) ×3 IMPLANT
STRAP SAFETY 5IN WIDE (MISCELLANEOUS) ×3 IMPLANT
SUT ETHIBOND 0 MO6 C/R (SUTURE) ×3 IMPLANT
SUT ULTRABRAID 2 COBRAID 38 (SUTURE) IMPLANT
SUT VIC AB 2-0 CT1 27 (SUTURE) ×6
SUT VIC AB 2-0 CT1 TAPERPNT 27 (SUTURE) ×2 IMPLANT
TAPE MICROFOAM 4IN (TAPE) ×3 IMPLANT
TUBING ARTHRO INFLOW-ONLY STRL (TUBING) ×3 IMPLANT
WAND WEREWOLF FLOW 90D (MISCELLANEOUS) ×3 IMPLANT

## 2020-02-08 NOTE — H&P (Signed)
History of Present Illness: Destiny Durham is a 74 y.o. who presents today for her history and physical. She is to undergo a right shoulder arthroscopy with decompression and possible rotator cuff repair on 02/08/2020. She was last seen in the clinic on 11/22/2019. There is been no change in her condition since that time.  The patient's symptoms began several months ago and developed after an injury while moving some furniture. She contacted her oncologist and was advised to see Dr. Delana Meyer to rule out any vascular cause of her symptoms. Dr. Evelina Bucy felt that her symptoms were primarily localized to the shoulder and referred her to me for further evaluation and treatment. The patient describes the symptoms as moderate (patient is active but has had to make modifications or give up activities) and have the quality of being aching, intermittent, stabbing and tender. The pain is localized to the lateral arm/shoulder and localized to the anterior shoulder. These symptoms are aggravated with sleeping, carrying heavy objects, at higher levels of activity, with overhead activity and reaching behind the back. She has tried acetaminophen and non-steroidal anti-inflammatories (Aleve ) with temporary partial relief. She has tried rest with limited benefit. She has not tried any physical therapy or steroid injections for the symptoms. The patient denies any prior issues with her right shoulder. She denies any neck pain, nor does she note any numbness or paresthesias down her arm to her hand.  The patient states that her pain is increased to the point where she has elected to proceed with having surgery.  Past Medical History: . 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 Atypical chest pain (determined to be gallstones)  . Claustrophobia  . Diverticulosis 11/01/13  . 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 GERD (gastroesophageal reflux disease)  . HTN (hypertension)  . Hyperlipidemia  . Rectal cancer (CMS-HCC) 10/2013   . Wears glasses   Past Surgical History: . CHOLECYSTECTOMY 2012  gangrenous  . COLONOSCOPY 11/01/13  repeat 1 year per [facility]  . COLONOSCOPY N/A 02/20/2017  Procedure: COLORECTAL CANCER SCREENING; COLONOSCOPY ON INDIVIDUAL AT HIGH RISK; Surgeon: Erenest Rasher, MD; Location: [facility]; Service: Gastroenterology; Laterality: N/A;  . COLONOSCOPY W/BIOPSY N/A 09/07/2014  Procedure: SURVEILLANCE COLONOSCOPY; Surgeon: Weldon Inches, MD; Location: [facility]; Service: Gastroenterology; Laterality: N/A;  . COLONOSCOPY W/REMOVAL LESIONS BY SNARE N/A 08/19/2019  Procedure: SCREENING; COLONOSCOPY; Surgeon: Erenest Rasher, MD; Location: [facility]; Service: Gastroenterology; Laterality: N/A;  . EGD 11/01/13  no repeat per [facility] patient notified of results in office 11/03/13  . LAPAROSCOPIC PROCTECTOMY W/PULLTHROUGH/COLONIC RESERVOIR/ENTEROSTOMY N/A 12/28/2013  Procedure: LAPAROSCOPIC PROCTECTOMY W/PULLTHROUGH/COLONIC RESERVOIR/ENTEROSTOMY, , low anterior resection, ERAS+multimodal pain management; Surgeon: Guy Begin, MD; Location: [place]; Service: General Surgery; Laterality: N/A; ERAS+multimodal pain management  . PROCTOSIGMOIDOSCOPY RIGID N/A 12/28/2013  Procedure: PROCTOSIGMOIDOSCOPY RIGID; Surgeon: Guy Begin, MD; Location: [place]; Service: General Surgery; Laterality: N/A;  . RECTAL EUS N/A 11/09/2013  Procedure: RECTAL EUS; Surgeon: Vernie Ammons, MD; Location: [place]; Service: Gastroenterology; Laterality: N/A;  . REPAIR UMBILICAL HERNIA N/A 02/72/5366  Procedure: REPAIR UMBILICAL HERNIA, AGE 73 YEARS OR OLDER; REDUCIBLE; Surgeon: Guy Begin, MD; Location: [place]; Service: General Surgery; Laterality: N/A;  . SIGMOIDOSCOPY FLEXIBLE W/BIOPSY N/A 05/08/2015  Procedure: SIGMOIDOSCOPY, FLEXIBLE; WITH BIOPSY, SINGLE OR MULTIPLE; Surgeon: Weldon Inches, MD;  Location: [facility]; Service: Gastroenterology; Laterality: N/A;  . VAGINAL HYSTERECTOMY 2011  partial   Past Family History: . Kidney cancer  Sister  . Kidney disease Brother  . Colon cancer Neg Hx  . Colon polyps Neg Hx  . Liver disease Neg Hx  . Rectal cancer Neg Hx  . Ulcers Neg Hx  . Anesthesia problems Neg Hx  . Malignant hyperthermia Neg Hx   Medications: . ibuprofen (MOTRIN) 200 MG tablet Take 200 mg by mouth every 6 (six) hours as needed for Pain  . diazePAM (VALIUM) 10 MG tablet  . gabapentin (NEURONTIN) 300 MG capsule Take 300 mg by mouth 3 (three) times daily.  Marland Kitchen lisinopril-hydrochlorothiazide (PRINZIDE,ZESTORETIC) 20-12.5 mg tablet Take 1 tablet by mouth every morning.  . multivit with min-folic acid 735 mcg Chew once daily  . pantoprazole (PROTONIX) 20 MG DR tablet Take 20 mg by mouth once daily  . potassium chloride (KLOR-CON) 20 MEQ ER tablet Take 1 tablet (20 mEq total) by mouth 2 (two) times daily 10 tablet 0  . pravastatin (PRAVACHOL) 20 MG tablet Take 20 mg by mouth every morning.  . sertraline (ZOLOFT) 50 MG tablet Take 50 mg by mouth every evening   Allergies: . Latex Rash  . Penicillins Hives   Review of Systems: A comprehensive 14 point ROS was performed, reviewed, and the pertinent orthopaedic findings are documented in the HPI.  Physical Exam: BP 120/80  Ht 160 cm (5\' 3" )  Wt 84.6 kg (186 lb 9.6 oz)  BMI 33.05 kg/m   General: Well-developed well-nourished female seen in no acute distress.   HEENT: Atraumatic,normocephalic. Pupils are equal and reactive to light. Oropharynx is clear with moist mucosa  Lungs: Clear to auscultation bilaterally   Cardiovascular: Regular rate and rhythm. Normal S1, S2. No murmurs. No appreciable gallops or rubs. Peripheral pulses are palpable.  Abdomen: Soft, non-tender, nondistended. Bowel sounds present  Extremity: Right shoulder exam: SKIN: normal SWELLING: none WARMTH: none LYMPH NODES:  no adenopathy palpable CREPITUS: none TENDERNESS: Mildly tender over anterior and anterolateral aspects of shoulder ROM (active):  Forward flexion: 140 degrees Abduction: 135 degrees Internal rotation: L2 ROM (passive):  Forward flexion: 150 degrees Abduction: 145 degrees ER/IR at 90 abd: 85 degrees / 55 degrees  She has mild pain at the extremes of all motions.  STRENGTH: Forward flexion: 4+/5 Abduction: 4+/5 External rotation: 4-4+/5 Internal rotation: 4+-5/5 Pain with RC testing: No  STABILITY: Normal  SPECIAL TESTS: Luan Pulling' test: positive, mild Speed's test: Mildly positive Capsulitis - pain w/ passive ER: no Crossed arm test: no Crank: Not evaluated Anterior apprehension: Negative Posterior apprehension: Not evaluated  Neurological: The patient is alert and oriented Sensation to light touch appears to be intact and within normal limits Gross motor strength appeared to be equal to 5/5  Vascular : Peripheral pulses felt to be palpable. Capillary refill appears to be intact and within normal limits  X-ray: X-rays of the right shoulder taken in [place] clinic showed no evidence of any fractures, lytic lesions or significant degenerative changes. The subacromial space is mildly decreased. She is noted to have a prominent subacromial spur. Appears to have a type II acromion.  MRI OF THE RIGHT SHOULDER: 1. Full-thickness partial width tear of the anterior 2/3 of the  supraspinatus tendon, with the longitudinal gap in the tendon  measuring about 1.1 cm, and a small distal tendon fragment attaching  to the humeral head.  2. Moderate infraspinatus and mild subscapularis tendinopathy.  3. Moderate tendinopathy or partial tearing of the proximal  intra-articular segment of the long head of the biceps.  4. Moderate degenerative AC joint arthropathy  and mild degenerative  chondral thinning in the glenohumeral joint.   Impression: Nontraumatic complete rotator cuff  tear, right shoulder.  Plan: 1. Patient's medication was gone over on today's visit.   H&P reviewed and patient re-examined. No changes.

## 2020-02-08 NOTE — Discharge Instructions (Addendum)
Orthopedic discharge instructions: Keep dressing dry and intact.  May shower after dressing changed on post-op day #4 (Saturday).  Cover staples with Band-Aids after drying off. Apply ice frequently to shoulder. Take ibuprofen 600-800 mg TID with meals for 7-10 days, then as necessary.     TID = three times per day (every 8 hours) Take hydrocodone as prescribed when needed.  May supplement with ES Tylenol if necessary. Keep shoulder immobilizer on at all times except may remove for bathing purposes. Follow-up in 10-14 days or as scheduled.   AMBULATORY SURGERY  DISCHARGE INSTRUCTIONS   1) The drugs that you were given will stay in your system until tomorrow so for the next 24 hours you should not:  A) Drive an automobile B) Make any legal decisions C) Drink any alcoholic beverage   2) You may resume regular meals tomorrow.  Today it is better to start with liquids and gradually work up to solid foods.  You may eat anything you prefer, but it is better to start with liquids, then soup and crackers, and gradually work up to solid foods.   3) Please notify your doctor immediately if you have any unusual bleeding, trouble breathing, redness and pain at the surgery site, drainage, fever, or pain not relieved by medication.    4) Additional Instructions:        Please contact your physician with any problems or Same Day Surgery at [phone], Monday through Friday 6 am to 4 pm, or [place] at [facility] number at [phone].

## 2020-02-08 NOTE — Transfer of Care (Signed)
Immediate Anesthesia Transfer of Care Note  Patient: Destiny Durham  Procedure(s) Performed: SHOULDER ARTHROSCOPY WITH DEBRIDEMENT, DECOMPRESSION, AND ROTATOR CUFF REPAIR, AND BICEP TENDON REPAIR (Right Shoulder)  Patient Location: PACU  Anesthesia Type:General  Level of Consciousness: drowsy  Airway & Oxygen Therapy: Patient Spontanous Breathing and Patient connected to face mask oxygen  Post-op Assessment: Report given to RN and Post -op Vital signs reviewed and stable  Post vital signs: Reviewed and stable  Last Vitals:  Vitals Value Taken Time  BP 135/57 02/08/20 1559  Temp 36.5 C 02/08/20 1558  Pulse 63 02/08/20 1602  Resp 20 02/08/20 1602  SpO2 100 % 02/08/20 1602  Vitals shown include unvalidated device data.  Last Pain:  Vitals:   02/08/20 1558  TempSrc:   PainSc: Asleep         Complications: No complications documented.

## 2020-02-08 NOTE — Op Note (Signed)
02/08/2020  3:56 PM  Patient:   Destiny Durham  Pre-Op Diagnosis:   Impingement/tendinopathy with full-thickness rotator cuff tear and biceps tendinopathy, right shoulder.  Post-Op Diagnosis:   Impingement/tendinopathy with rotator cuff tear and biceps tendinopathy, right shoulder.  Procedure:   Limited arthroscopic debridement, arthroscopic subacromial decompression, mini-open rotator cuff repair supplemented with a Smith & Nephew Regeneten patch, and mini-open biceps tenodesis, right shoulder.  Anesthesia:   General endotracheal with interscalene block using Exparel placed preoperatively by the anesthesiologist.  Surgeon:   Pascal Lux, MD  Assistant:   Cameron Proud, PA-C; Sherlene Shams, PA-S  Findings:   As above.  There was mild labral fraying anteriorly and superiorly without frank detachment from the glenoid rim.  There was a tongue-type full-thickness tear involving the insertional fibers of the supraspinatus tendon.  The remainder the rotator cuff was in satisfactory condition.  There were grade 1 chondromalacial changes involving the central glenoid, but the articular surface of the humeral head was in satisfactory condition.  The biceps tendon demonstrated evidence of significant tendinopathy with "lip sticking" and partial-thickness tearing.  Complications:   None  Fluids:   1100 cc  Estimated blood loss:   10 cc  Tourniquet time:   None  Drains:   None  Closure:   Staples      Brief clinical note:   The patient is a 74 year old female. The patient's symptoms have progressed despite medications, activity modification, etc. The patient's history and examination are consistent with impingement/tendinopathy with a rotator cuff tear. These findings were confirmed by MRI scan. The patient presents at this time for definitive management of these shoulder symptoms.  Procedure:   The patient underwent placement of an interscalene block using Exparel by the anesthesiologist  in the preoperative holding area before being brought into the operating room and lain in the supine position. The patient then underwent general endotracheal intubation and anesthesia before being repositioned in the beach chair position using the beach chair positioner. The right shoulder and upper extremity were prepped with ChloraPrep solution before being draped sterilely. Preoperative antibiotics were administered. A timeout was performed to confirm the proper surgical site before the expected portal sites and incision site were injected with 0.5% Sensorcaine with epinephrine. A posterior portal was created and the glenohumeral joint thoroughly inspected with the findings as described above. An anterior portal was created using an outside-in technique. The labrum and rotator cuff were further probed, again confirming the above-noted findings. Areas of labral fraying were debrided back to stable margins using the full-radius resector, as were several areas of synovitis. In addition, the torn margin of the rotator cuff also was debrided back to stable margins using the full-radius resector. The ArthroCare wand was inserted and used to release the biceps tendon from its labral anchor. It also was used to obtain hemostasis as well as to "anneal" the labrum superiorly and anteriorly. The instruments were removed from the joint after suctioning the excess fluid.  The camera was repositioned through the posterior portal into the subacromial space. A separate lateral portal was created using an outside-in technique. The 3.5 mm full-radius resector was introduced and used to perform a subtotal bursectomy. The ArthroCare wand was then inserted and used to remove the periosteal tissue off the undersurface of the anterior third of the acromion as well as to recess the coracoacromial ligament from its attachment along the anterior and lateral margins of the acromion. The 4.0 mm acromionizing bur was introduced and used  to complete the decompression by removing the undersurface of the anterior third of the acromion. The full radius resector was reintroduced to remove any residual bony debris before the ArthroCare wand was reintroduced to obtain hemostasis. The instruments were then removed from the subacromial space after suctioning the excess fluid.  An approximately 4-5 cm incision was made over the anterolateral aspect of the shoulder beginning at the anterolateral corner of the acromion and extending distally in line with the bicipital groove. This incision was carried down through the subcutaneous tissues to expose the deltoid fascia. The raphae between the anterior and middle thirds was identified and this plane developed to provide access into the subacromial space. Additional bursal tissues were debrided sharply using Metzenbaum scissors. The rotator cuff tear was readily identified. The margins were debrided sharply with a #15 blade and the exposed greater tuberosity roughened with a rongeur. The tear was repaired using two Smith & Nephew 2.8 mm Q-Fix anchors. These sutures were then brought back laterally and secured using two [place] anchors to create a two-layer closure. Several additional side to side anchors were placed anteriorly and posteriorly to close the longitudinal tears anteriorly and posteriorly. Given the patient's age and questionable tissue quality, it was felt best to reinforce the repair with a [place] patch. Therefore, a medium sized patch was applied over the repair and secured using the appropriate bone and soft tissue staples. An apparent watertight closure was obtained.  The bicipital groove was identified by palpation and opened for 1-1.5 cm. The biceps tendon stump was retrieved through this defect. The floor of the bicipital groove was roughened with a curet before a single Biomet 2.9 mm JuggerKnot anchor was inserted. Both sets of sutures  were passed through the biceps tendon and tied securely to effect the tenodesis. The bicipital sheath was reapproximated using two #0 Ethibond interrupted sutures, incorporating the biceps tendon to further reinforce the tenodesis.  The wound was copiously irrigated with sterile saline solution before the deltoid raphae was reapproximated using 2-0 Vicryl interrupted sutures. The subcutaneous tissues were closed in two layers using 2-0 Vicryl interrupted sutures before the skin was closed using staples. The portal sites also were closed using staples. A sterile bulky dressing was applied to the shoulder before the arm was placed into a shoulder immobilizer. The patient was then awakened, extubated, and returned to the recovery room in satisfactory condition after tolerating the procedure well.

## 2020-02-08 NOTE — Anesthesia Preprocedure Evaluation (Signed)
Anesthesia Evaluation  Patient identified by MRN, date of birth, ID band Patient awake    Reviewed: Allergy & Precautions, H&P , NPO status , Patient's Chart, lab work & pertinent test results  History of Anesthesia Complications Negative for: history of anesthetic complications  Airway Mallampati: II       Dental   Pulmonary neg pulmonary ROS, neg sleep apnea, neg COPD,    breath sounds clear to auscultation       Cardiovascular hypertension, (-) angina(-) Past MI and (-) Cardiac Stents (-) dysrhythmias  Rhythm:regular Rate:Normal     Neuro/Psych PSYCHIATRIC DISORDERS Anxiety Depression    GI/Hepatic Neg liver ROS, PUD, GERD  ,  Endo/Other  negative endocrine ROS  Renal/GU      Musculoskeletal   Abdominal   Peds  Hematology negative hematology ROS (+)   Anesthesia Other Findings Past Medical History: 11/01/2013: Adenocarcinoma in a polyp ([place]) No date: Anxiety No date: Cancer [facility])     Comment:  rectal No date: Depression 11/01/2013: Diverticulosis 11/01/2013: Erosive gastritis No date: GERD (gastroesophageal reflux disease) No date: History of hiatal hernia No date: Hyperlipidemia No date: Hypertension 10/2013: Rectal cancer [facility])  Past Surgical History: 2011: ABDOMINAL HYSTERECTOMY 2012: CHOLECYSTECTOMY No date: COLON SURGERY 2015, 2016, 2018, 2021: COLONOSCOPY 11/01/2013: ESOPHAGOGASTRODUODENOSCOPY 05/08/2015: FLEXIBLE SIGMOIDOSCOPY No date: HERNIA REPAIR 12/28/2013: PROCTECTOMY W/ CREATION OF COLON RESERVOIR     Comment:  Dr. Guy Begin at [facility]  11/09/2013: RECTAL ULTRASOUND No date: TONSILLECTOMY No date: TONSILLECTOMY AND ADENOIDECTOMY 43/32/9518: UMBILICAL HERNIA REPAIR  BMI    Body Mass Index: 31.52 kg/m      Reproductive/Obstetrics negative OB ROS                             Anesthesia Physical Anesthesia Plan  ASA: II  Anesthesia Plan:  General ETT   Post-op Pain Management: GA combined w/ Regional for post-op pain   Induction:   PONV Risk Score and Plan: Ondansetron, Dexamethasone, Midazolam and Treatment may vary due to age or medical condition  Airway Management Planned:   Additional Equipment:   Intra-op Plan:   Post-operative Plan:   Informed Consent: I have reviewed the patients History and Physical, chart, labs and discussed the procedure including the risks, benefits and alternatives for the proposed anesthesia with the patient or authorized representative who has indicated his/her understanding and acceptance.     Dental Advisory Given  Plan Discussed with: Anesthesiologist, CRNA and Surgeon  Anesthesia Plan Comments:         Anesthesia Quick Evaluation

## 2020-02-08 NOTE — Anesthesia Procedure Notes (Signed)
Procedure Name: Intubation Date/Time: 02/08/2020 1:53 PM Performed by: Louann Sjogren, CRNA Pre-anesthesia Checklist: Patient identified, Patient being monitored, Timeout performed, Emergency Drugs available and Suction available Patient Re-evaluated:Patient Re-evaluated prior to induction Oxygen Delivery Method: Circle system utilized Preoxygenation: Pre-oxygenation with 100% oxygen Induction Type: IV induction Ventilation: Mask ventilation without difficulty Laryngoscope Size: Mac, 3 and 4 Grade View: Grade I Tube type: Oral Tube size: 7.5 mm Number of attempts: 1 Airway Equipment and Method: Stylet Placement Confirmation: ETT inserted through vocal cords under direct vision,  positive ETCO2 and breath sounds checked- equal and bilateral Secured at: 21 cm Tube secured with: Tape Dental Injury: Teeth and Oropharynx as per pre-operative assessment

## 2020-02-09 ENCOUNTER — Encounter: Payer: Self-pay | Admitting: Surgery

## 2020-02-10 NOTE — Anesthesia Postprocedure Evaluation (Signed)
Anesthesia Post Note  Patient: Destiny Durham  Procedure(s) Performed: SHOULDER ARTHROSCOPY WITH DEBRIDEMENT, DECOMPRESSION, AND ROTATOR CUFF REPAIR, AND BICEP TENDON REPAIR (Right Shoulder)  Patient location during evaluation: PACU Anesthesia Type: General Level of consciousness: awake and alert Pain management: pain level controlled Vital Signs Assessment: post-procedure vital signs reviewed and stable Respiratory status: spontaneous breathing, nonlabored ventilation and respiratory function stable Cardiovascular status: blood pressure returned to baseline and stable Postop Assessment: no apparent nausea or vomiting Anesthetic complications: no   No complications documented.   Last Vitals:  Vitals:   02/08/20 1737 02/08/20 1743  BP:  (!) 100/54  Pulse: 63 63  Resp: (!) 21 15  Temp: (!) 36.4 C   SpO2: 95% 97%    Last Pain:  Vitals:   02/09/20 0927  TempSrc:   PainSc: [place]

## 2020-02-22 ENCOUNTER — Encounter: Payer: Self-pay | Admitting: Surgery

## 2020-06-19 ENCOUNTER — Other Ambulatory Visit: Payer: Self-pay | Admitting: Family Medicine

## 2020-06-19 DIAGNOSIS — Z1231 Encounter for screening mammogram for malignant neoplasm of breast: Secondary | ICD-10-CM

## 2020-07-21 ENCOUNTER — Ambulatory Visit
Admission: RE | Admit: 2020-07-21 | Discharge: 2020-07-21 | Disposition: A | Discharge status: Home / Self Care | Payer: Medicare HMO | Source: Ambulatory Visit | Attending: Family Medicine | Admitting: Family Medicine

## 2020-07-21 ENCOUNTER — Other Ambulatory Visit: Payer: Self-pay

## 2020-07-21 DIAGNOSIS — Z1231 Encounter for screening mammogram for malignant neoplasm of breast: Secondary | ICD-10-CM | POA: Diagnosis present

## 2021-05-29 ENCOUNTER — Other Ambulatory Visit: Payer: Self-pay | Admitting: Orthopedic Surgery

## 2021-05-29 ENCOUNTER — Other Ambulatory Visit (HOSPITAL_COMMUNITY): Payer: Self-pay | Admitting: Orthopedic Surgery

## 2021-05-29 DIAGNOSIS — M19012 Primary osteoarthritis, left shoulder: Secondary | ICD-10-CM

## 2021-05-29 DIAGNOSIS — M7582 Other shoulder lesions, left shoulder: Secondary | ICD-10-CM

## 2021-05-29 DIAGNOSIS — M7522 Bicipital tendinitis, left shoulder: Secondary | ICD-10-CM

## 2021-06-03 ENCOUNTER — Ambulatory Visit
Admission: RE | Admit: 2021-06-03 | Discharge: 2021-06-03 | Disposition: A | Discharge status: Home / Self Care | Payer: Medicare HMO | Source: Ambulatory Visit | Attending: Orthopedic Surgery | Admitting: Orthopedic Surgery

## 2021-06-03 ENCOUNTER — Other Ambulatory Visit: Payer: Self-pay

## 2021-06-03 DIAGNOSIS — M7522 Bicipital tendinitis, left shoulder: Secondary | ICD-10-CM | POA: Diagnosis present

## 2021-06-03 DIAGNOSIS — M19012 Primary osteoarthritis, left shoulder: Secondary | ICD-10-CM | POA: Insufficient documentation

## 2021-06-03 DIAGNOSIS — M7582 Other shoulder lesions, left shoulder: Secondary | ICD-10-CM | POA: Insufficient documentation

## 2021-06-14 ENCOUNTER — Other Ambulatory Visit: Payer: Self-pay | Admitting: Surgery

## 2021-06-18 ENCOUNTER — Encounter
Admission: RE | Admit: 2021-06-18 | Discharge: 2021-06-18 | Disposition: A | Discharge status: Home / Self Care | Payer: Medicare HMO | Source: Ambulatory Visit | Attending: Surgery | Admitting: Surgery

## 2021-06-18 ENCOUNTER — Other Ambulatory Visit: Payer: Self-pay

## 2021-06-18 VITALS — Ht 63.0 in | Wt 177.0 lb

## 2021-06-18 DIAGNOSIS — Z01818 Encounter for other preprocedural examination: Secondary | ICD-10-CM

## 2021-06-18 DIAGNOSIS — I10 Essential (primary) hypertension: Secondary | ICD-10-CM

## 2021-06-18 HISTORY — DX: Unspecified osteoarthritis, unspecified site: M19.90

## 2021-06-18 HISTORY — DX: Claustrophobia: F40.240

## 2021-06-18 NOTE — Patient Instructions (Addendum)
Your procedure is scheduled on: Thursday 06/21/21 ?Report to the Registration Desk on the 1st floor of the [facility]. ?To find out your arrival time, please call [phone] between 1PM - 3PM on: Wednesday 06/20/21 ? ?REMEMBER: ?Instructions that are not followed completely may result in serious medical risk, up to and including death; or upon the discretion of your surgeon and anesthesiologist your surgery may need to be rescheduled. ? ?Do not eat food after midnight the night before surgery.  ?No gum chewing, lozengers or hard candies. ? ?You may however, drink CLEAR liquids up to 2 hours before you are scheduled to arrive for your surgery. Do not drink anything within 2 hours of your scheduled arrival time. ? ?Clear liquids include: ?- water  ?- apple juice without pulp ?- gatorade (not RED colors) ?- black coffee or tea (Do NOT add milk or creamers to the coffee or tea) ?Do NOT drink anything that is not on this list. ? ?In addition, your doctor has ordered for you to drink the provided  ?Ensure Pre-Surgery Clear Carbohydrate Drink  ?Drinking this carbohydrate drink up to two hours before surgery helps to reduce insulin resistance and improve patient outcomes. Please complete drinking 2 hours prior to scheduled arrival time. ? ?TAKE THESE MEDICATIONS THE MORNING OF SURGERY WITH A SIP OF WATER: ?celecoxib (CELEBREX) 200 MG capsule ?diazepam (VALIUM) 10 MG tablet If needed) ?pravastatin (PRAVACHOL) 20 MG tablet ? ?pantoprazole (PROTONIX) 20 MG tablet (take one the night before and one on the morning of surgery - helps to prevent nausea after surgery.) ? ?One week prior to surgery: ?Stop Anti-inflammatories (NSAIDS) such as Advil, Aleve, Ibuprofen, Motrin, Naproxen, Naprosyn and Aspirin based products such as Excedrin, Goodys Powder, BC Powder. ? ?Stop taking your Multiple Vitamins-Minerals (ONE-A-DAY VITACRAVES) CHEW ANY OVER THE COUNTER supplements until after surgery. ? ?You may however, continue to take  Tylenol if needed for pain up until the day of surgery. ? ?No Alcohol for 24 hours before or after surgery. ? ?No Smoking including e-cigarettes for 24 hours prior to surgery.  ?No chewable tobacco products for at least 6 hours prior to surgery.  ?No nicotine patches on the day of surgery. ? ?Do not use any "recreational" drugs for at least a week prior to your surgery.  ?Please be advised that the combination of cocaine and anesthesia may have negative outcomes, up to and including death. ?If you test positive for cocaine, your surgery will be cancelled. ? ?On the morning of surgery brush your teeth with toothpaste and water, you may rinse your mouth with mouthwash if you wish. ?Do not swallow any toothpaste or mouthwash. ? ?Use CHG Soap as directed on instruction sheet. ? ?Do not wear jewelry, make-up, hairpins, clips or nail polish. ? ?Do not wear lotions, powders, or perfumes.  ? ?Do not shave body from the neck down 48 hours prior to surgery just in case you cut yourself which could leave a site for infection.  ?Also, freshly shaved skin may become irritated if using the CHG soap. ? ?dentures may not be worn into surgery. ? ?Do not bring valuables to the hospital. [facility] is not responsible for any missing/lost belongings or valuables.  ? ?Notify your doctor if there is any change in your medical condition (cold, fever, infection). ? ?Wear comfortable clothing (specific to your surgery type) to the hospital. ? ?After surgery, you can help prevent lung complications by doing breathing exercises.  ?Take deep breaths and cough every 1-2  hours. Your doctor may order a device called an Incentive Spirometer to help you take deep breaths. ? ?If you are being discharged the day of surgery, you will not be allowed to drive home. ?You will need a responsible adult (18 years or older) to drive you home and stay with you that night.  ? ?If you are taking public transportation, you will need to have a responsible adult  (18 years or older) with you. ?Please confirm with your physician that it is acceptable to use public transportation.  ? ?Please call the [facility] at [phone] if you have any questions about these instructions. ? ?Surgery Visitation Policy: ? ?Patients undergoing a surgery or procedure may have two family members or support persons with them as long as the person is not COVID-19 positive or experiencing its symptoms.  ? ?Inpatient Visitation:   ? ?Visiting hours are 7 a.m. to 8 p.m. ?Up to four visitors are allowed at one time in a patient room, including children. The visitors may rotate out with other people during the day. One designated support person (adult) may remain overnight.  ?

## 2021-06-19 ENCOUNTER — Other Ambulatory Visit
Admission: RE | Admit: 2021-06-19 | Discharge: 2021-06-19 | Disposition: A | Discharge status: Home / Self Care | Payer: Medicare HMO | Source: Ambulatory Visit | Attending: Surgery | Admitting: Surgery

## 2021-06-19 DIAGNOSIS — I10 Essential (primary) hypertension: Secondary | ICD-10-CM | POA: Insufficient documentation

## 2021-06-19 DIAGNOSIS — Z01818 Encounter for other preprocedural examination: Secondary | ICD-10-CM | POA: Insufficient documentation

## 2021-06-19 LAB — POTASSIUM: Potassium: 4 mmol/L (ref 3.5–5.1)

## 2021-06-20 MED ORDER — LACTATED RINGERS IV SOLN: INTRAVENOUS | Status: DC

## 2021-06-20 MED ORDER — FAMOTIDINE 20 MG PO TABS
20.0000 mg | ORAL_TABLET | Freq: Once | ORAL | Status: DC
Start: 2021-06-20 — End: 2021-06-23

## 2021-06-20 MED ORDER — ORAL CARE MOUTH RINSE: 15.0000 mL | Freq: Once | OROMUCOSAL | Status: DC

## 2021-06-20 MED ORDER — CHLORHEXIDINE GLUCONATE 0.12 % MT SOLN: 15.0000 mL | Freq: Once | OROMUCOSAL | Status: DC

## 2021-06-20 MED ORDER — CEFAZOLIN SODIUM-DEXTROSE 2-4 GM/100ML-% IV SOLN: 2.0000 g | INTRAVENOUS | Status: DC

## 2021-06-21 ENCOUNTER — Encounter
Admission: RE | Disposition: A | Discharge status: Home / Self Care | Payer: Self-pay | Source: Home / Self Care | Attending: Surgery

## 2021-06-21 ENCOUNTER — Encounter: Payer: Self-pay | Admitting: Anesthesiology

## 2021-06-21 ENCOUNTER — Ambulatory Visit
Admission: RE | Admit: 2021-06-21 | Discharge: 2021-06-21 | Disposition: A | Discharge status: Home / Self Care | Payer: Medicare HMO | Attending: Surgery | Admitting: Surgery

## 2021-06-21 SURGERY — SHOULDER ARTHROSCOPY WITH SUBACROMIAL DECOMPRESSION, ROTATOR CUFF REPAIR AND BICEP TENDON REPAIR
Anesthesia: Choice | Site: Shoulder | Laterality: Left

## 2021-06-21 MED ORDER — FAMOTIDINE 20 MG PO TABS
ORAL_TABLET | ORAL | Status: DC
Start: 2021-06-21 — End: 2021-06-21
  Filled 2021-06-21: qty 1

## 2021-06-21 MED ORDER — CEFAZOLIN SODIUM-DEXTROSE 2-4 GM/100ML-% IV SOLN
INTRAVENOUS | Status: AC
  Filled 2021-06-21: qty 100

## 2021-06-21 MED ORDER — CHLORHEXIDINE GLUCONATE 0.12 % MT SOLN
OROMUCOSAL | Status: AC
  Filled 2021-06-21: qty 15

## 2021-06-21 SURGICAL SUPPLY — 51 items
ANCHOR HEALICOIL REGEN 5.5 (Anchor) IMPLANT
ANCHOR JUGGERKNOT WTAP NDL 2.9 (Anchor) IMPLANT
ANCHOR SUT QUATTRO KNTLS 4.5 (Anchor) IMPLANT
ANCHOR SUT W/ ORTHOCORD (Anchor) IMPLANT
BIT DRILL JUGRKNT W/NDL BIT2.9 (DRILL) IMPLANT
BLADE FULL RADIUS 3.5 (BLADE) ×2 IMPLANT
BUR ACROMIONIZER 4.0 (BURR) ×2 IMPLANT
CANNULA SHAVER 8MMX76MM (CANNULA) ×2 IMPLANT
CHLORAPREP W/TINT 26 (MISCELLANEOUS) ×2 IMPLANT
COVER MAYO STAND REUSABLE (DRAPES) ×2 IMPLANT
DILATOR 5.5 THREADED HEALICOIL (MISCELLANEOUS) IMPLANT
DRILL JUGGERKNOT W/NDL BIT 2.9 (DRILL)
ELECT CAUTERY BLADE 6.4 (BLADE) ×2 IMPLANT
ELECT REM PT RETURN 9FT ADLT (ELECTROSURGICAL) ×2
ELECTRODE REM PT RTRN 9FT ADLT (ELECTROSURGICAL) ×1 IMPLANT
GAUZE SPONGE 4X4 12PLY STRL (GAUZE/BANDAGES/DRESSINGS) ×2 IMPLANT
GAUZE XEROFORM 1X8 LF (GAUZE/BANDAGES/DRESSINGS) ×2 IMPLANT
GLOVE BIOGEL PI IND STRL 8 (GLOVE) ×1 IMPLANT
GLOVE BIOGEL PI INDICATOR 8 (GLOVE) ×1
GLOVE SURG ENC MOIS LTX SZ7.5 (GLOVE) ×4 IMPLANT
GLOVE SURG ENC MOIS LTX SZ8 (GLOVE) ×4 IMPLANT
GLOVE SURG UNDER LTX SZ8 (GLOVE) ×2 IMPLANT
GOWN STRL REUS W/ TWL LRG LVL3 (GOWN DISPOSABLE) ×1 IMPLANT
GOWN STRL REUS W/ TWL XL LVL3 (GOWN DISPOSABLE) ×1 IMPLANT
GOWN STRL REUS W/TWL LRG LVL3 (GOWN DISPOSABLE) ×1
GOWN STRL REUS W/TWL XL LVL3 (GOWN DISPOSABLE) ×1
GRASPER SUT 15 45D LOW PRO (SUTURE) IMPLANT
IV LACTATED RINGER IRRG 3000ML (IV SOLUTION) ×2
IV LR IRRIG 3000ML ARTHROMATIC (IV SOLUTION) ×2 IMPLANT
KIT CANNULA 8X76-LX IN CANNULA (CANNULA) IMPLANT
MANIFOLD NEPTUNE II (INSTRUMENTS) ×4 IMPLANT
MASK FACE SPIDER DISP (MASK) ×2 IMPLANT
MAT ABSORB  FLUID 56X50 GRAY (MISCELLANEOUS) ×1
MAT ABSORB FLUID 56X50 GRAY (MISCELLANEOUS) ×1 IMPLANT
PACK ARTHROSCOPY SHOULDER (MISCELLANEOUS) ×2 IMPLANT
PAD ABD DERMACEA PRESS 5X9 (GAUZE/BANDAGES/DRESSINGS) ×4 IMPLANT
PASSER SUT FIRSTPASS SELF (INSTRUMENTS) IMPLANT
SLING ARM LRG DEEP (SOFTGOODS) ×2 IMPLANT
SLING ULTRA II LG (MISCELLANEOUS) ×2 IMPLANT
SPONGE T-LAP 18X18 ~~LOC~~+RFID (SPONGE) ×2 IMPLANT
STAPLER SKIN PROX 35W (STAPLE) ×2 IMPLANT
STRAP SAFETY 5IN WIDE (MISCELLANEOUS) ×2 IMPLANT
SUT ETHIBOND 0 MO6 C/R (SUTURE) ×2 IMPLANT
SUT ULTRABRAID 2 COBRAID 38 (SUTURE) IMPLANT
SUT VIC AB 2-0 CT1 27 (SUTURE) ×2
SUT VIC AB 2-0 CT1 TAPERPNT 27 (SUTURE) ×2 IMPLANT
TAPE MICROFOAM 4IN (TAPE) ×2 IMPLANT
TUBING CONNECTING 10 (TUBING) ×2 IMPLANT
TUBING INFLOW SET DBFLO PUMP (TUBING) ×2 IMPLANT
WAND WEREWOLF FLOW 90D (MISCELLANEOUS) ×2 IMPLANT
WATER STERILE IRR 500ML POUR (IV SOLUTION) ×2 IMPLANT

## 2021-06-21 NOTE — OR Nursing (Signed)
Patient insurance authorization still pending day of surgery. Procedure cancelled.  ?

## 2021-06-21 NOTE — Anesthesia Preprocedure Evaluation (Deleted)
Anesthesia Evaluation  ?Patient identified by MRN, date of birth, ID band ?Patient awake ? ? ? ?Reviewed: ?Allergy & Precautions, H&P , NPO status , Patient's Chart, lab work & pertinent test results ? ?History of Anesthesia Complications ?Negative for: history of anesthetic complications ? ?Airway ?Mallampati: II ? ? ? ? ? ? Dental ?  ?Pulmonary ?neg pulmonary ROS, neg sleep apnea, neg COPD,  ?  ?breath sounds clear to auscultation ? ? ? ? ? ? Cardiovascular ?hypertension, Pt. on medications ?(-) angina(-) Past MI and (-) Cardiac Stents (-) dysrhythmias  ?Rhythm:regular Rate:Normal ? ? ?  ?Neuro/Psych ?PSYCHIATRIC DISORDERS Anxiety Depression   ? GI/Hepatic ?Neg liver ROS, PUD, GERD  Controlled and Medicated,  ?Endo/Other  ?negative endocrine ROS ? Renal/GU ?  ? ?  ?Musculoskeletal ? ? Abdominal ?  ?Peds ? 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    Comment:  rectal ?No date: Depression ?11/01/2013: Diverticulosis ?11/01/2013: Erosive gastritis ?No date: GERD (gastroesophageal reflux disease) ?No date: History of hiatal hernia ?No date: Hyperlipidemia ?No date: Hypertension ?10/2013: Rectal cancer ([place]) ? ?Past Surgical History: ?2011: ABDOMINAL HYSTERECTOMY ?2012: CHOLECYSTECTOMY ?No date: COLON SURGERY ?2015, 2016, 2018, 2021: COLONOSCOPY ?11/01/2013: ESOPHAGOGASTRODUODENOSCOPY ?05/08/2015: FLEXIBLE SIGMOIDOSCOPY ?No date: HERNIA REPAIR ?12/28/2013: PROCTECTOMY W/ CREATION OF COLON RESERVOIR ?    Comment:  Dr. Guy Begin at [place]  ?11/09/2013: RECTAL ULTRASOUND ?No date: TONSILLECTOMY ?No date: TONSILLECTOMY AND ADENOIDECTOMY ?94/17/4081: UMBILICAL HERNIA REPAIR ? ?BMI   ? Body Mass Index: 31.52 kg/m?  ?  ? ? Reproductive/Obstetrics ?negative OB ROS ? ?  ? ? ? ? ? ? ? ? ? ? ? ? ? ?  ?  ? ? ? ? ? ? ? ? ?Anesthesia  Physical ? ?Anesthesia Plan ? ?ASA: II ? ?Anesthesia Plan: General ETT  ? ?Post-op Pain Management: GA combined w/ Regional for post-op pain and Regional block*  ? ?Induction: Intravenous ? ?PONV Risk Score and Plan: Ondansetron, Dexamethasone, Treatment may vary due to age or medical condition and Midazolam ? ?Airway Management Planned: Oral ETT ? ?Additional Equipment:  ? ?Intra-op Plan:  ? ?Post-operative Plan:  ? ?Informed Consent: I have reviewed the patients History and Physical, chart, labs and discussed the procedure including the risks, benefits and alternatives for the proposed anesthesia with the patient or authorized representative who has indicated his/her understanding and acceptance.  ? ? ? ?Dental Advisory Given ? ?Plan Discussed with: Anesthesiologist, CRNA and Surgeon ? ?Anesthesia Plan Comments:   ? ? ? ? ? ? ?Anesthesia Quick Evaluation ? ?

## 2021-06-21 NOTE — H&P (Signed)
History of Present Illness: ?Destiny Durham is a 76 y.o. female who presents today for repeat evaluation of ongoing left shoulder pain. The patient was initially evaluated for left shoulder pain ongoing for the previous 2 months in early March. The patient was seen where she was diagnosed with left rotator cuff tendinitis in addition to underlying biceps tendinopathy. The patient was offered and received a left subacromial steroid injection at this visit, she was also given a prescription of Celebrex to take for discomfort however the pain in her left shoulder has continued to worsen. The patient was eventually prescribed a low-dose hydrocodone to take as needed for pain, she was also ordered a MRI scan left shoulder and presents today to review the results. The patient continues to report increased pain in left shoulder especially trying to reach above her head or out to the side and she states that she is unable to reach above shoulder level at this time given the severity of her pain. The patient denies any recent trauma or injury affecting the left upper extremity since her last evaluation. She denies any numbness or tingling to the left arm at this time. She does report an occasional catching sensation in the left shoulder. The patient does have increased pain when attempting to sleep on the left side at night. ? ?Past Medical History: ? 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 Atypical chest pain (determined to be gallstones)  ? Claustrophobia  ? Diverticulosis 11/01/13  ? 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 GERD (gastroesophageal reflux disease)  ? HTN (hypertension)  ? Hyperlipidemia  ? Rectal cancer (CMS-HCC) 10/2013  ? Wears glasses  ? ?Past Surgical History: ? VAGINAL HYSTERECTOMY 2011 (partial)  ? CHOLECYSTECTOMY 2012 (gangrenous)  ? EGD 11/01/2013 (no repeat per [facility] patient notified of results in office 11/03/13)  ? COLONOSCOPY 11/01/2013 (repeat 1 year per [facility])  ? RECTAL EUS N/A 11/09/2013  ?Procedure:  RECTAL EUS; Surgeon: Vernie Ammons, MD; Location: [facility]; Service: Gastroenterology; Laterality: N/A;  ? LAPAROSCOPIC PROCTECTOMY W/PULLTHROUGH/COLONIC RESERVOIR/ENTEROSTOMY N/A 12/28/2013  ?Procedure: LAPAROSCOPIC PROCTECTOMY W/PULLTHROUGH/COLONIC RESERVOIR/ENTEROSTOMY, , low anterior resection, ERAS+multimodal pain management; Surgeon: Guy Begin, MD; Location: [facility]; Service: General Surgery; Laterality: N/A; ERAS+multimodal pain management  ? PROCTOSIGMOIDOSCOPY RIGID N/A 12/28/2013  ?Procedure: PROCTOSIGMOIDOSCOPY RIGID; Surgeon: Guy Begin, MD; Location: [facility]; Service: General Surgery; Laterality: N/A;  ? REPAIR UMBILICAL HERNIA N/A 09/62/8366  ?Procedure: REPAIR UMBILICAL HERNIA, AGE 21 YEARS OR OLDER; REDUCIBLE; Surgeon: Guy Begin, MD; Location: [place] [facility]; Service: General Surgery; Laterality: N/A;  ? COLONOSCOPY W/BIOPSY N/A 09/07/2014  ?Procedure: SURVEILLANCE COLONOSCOPY; Surgeon: Weldon Inches, MD; Location: [facility]; Service: Gastroenterology; Laterality: N/A;  ? SIGMOIDOSCOPY FLEXIBLE W/BIOPSY N/A 05/08/2015  ?Procedure: SIGMOIDOSCOPY, FLEXIBLE; WITH BIOPSY, SINGLE OR MULTIPLE; Surgeon: Weldon Inches, MD; Location: [facility]; Service: Gastroenterology; Laterality: N/A;  ? COLONOSCOPY N/A 02/20/2017  ?Procedure: COLORECTAL CANCER SCREENING; COLONOSCOPY ON INDIVIDUAL AT HIGH RISK; Surgeon: Erenest Rasher, MD; Location: [facility]; Service: Gastroenterology; Laterality: N/A;  ? COLONOSCOPY W/REMOVAL LESIONS BY SNARE (08/19/2019)  ?Procedure: SCREENING; COLONOSCOPY; Surgeon: Erenest Rasher, MD; Location: [facility]; Service: Gastroenterology; Laterality: N/A;  ? Limited arthroscopic debridement, arthroscopic subacromial decompression, mini-open rotator cuff repair supplemented with a [employer] Regeneten patch, and mini-open biceps tenodesis,  right shoulder. Right 02/08/2020 (Dr. Roland Rack)  ? ?Past Family History: ? Kidney cancer Sister  ? Kidney disease Brother  ? Colon cancer Neg Hx  ?  Colon polyps Neg Hx  ? Liver disease Neg Hx  ? Rectal cancer Neg Hx  ? Ulcers Neg Hx  ? Anesthesia problems Neg Hx  ? Malignant hyperthermia Neg Hx  ? ?Medications: ? celecoxib (CELEBREX) 200 MG capsule Take 1 capsule (200 mg total) by mouth 2 (two) times daily 60 capsule 0  ? diazePAM (VALIUM) 10 MG tablet Take 10 mg by mouth as needed  ? HYDROcodone-acetaminophen (NORCO) 5-325 mg tablet Take 1 tablet by mouth every 8 (eight) hours as needed for Pain 15 tablet 0  ? ibuprofen (MOTRIN) 200 MG tablet Take 200 mg by mouth every 6 (six) hours as needed for Pain  ? lisinopril-hydrochlorothiazide (PRINZIDE,ZESTORETIC) 20-12.5 mg tablet Take 1 tablet by mouth every morning.  ? multivit with min-folic acid 161 mcg Chew once daily  ? pantoprazole (PROTONIX) 20 MG DR tablet Take 20 mg by mouth once daily  ? pravastatin (PRAVACHOL) 20 MG tablet Take 20 mg by mouth every morning.  ? sertraline (ZOLOFT) 50 MG tablet Take 50 mg by mouth every evening  ? ?Allergies: ? Latex Rash  ? Penicillins Hives  ? ?Review of Systems:  ?A comprehensive 14 point ROS was performed, reviewed by me today, and the pertinent orthopaedic findings are documented in the HPI. ? ?Physical Exam: ?BP 130/82  Ht 160 cm ('5\' 3"'$ )  Wt 81.9 kg (180 lb 9.6 oz)  BMI 31.99 kg/m?  ?General/Constitutional: The patient appears to be well-nourished, well-developed, and in no acute distress. ?Neuro/Psych: Normal mood and affect, oriented to person, place and time. ?Eyes: Non-icteric. Pupils are equal, round, and reactive to light, and exhibit synchronous movement. ?ENT: Unremarkable. ?Lymphatic: No palpable adenopathy. ?Respiratory: Lungs clear to auscultation, Normal chest excursion, No wheezes and Non-labored breathing ?Cardiovascular: Regular rate and rhythm. No murmurs. and No edema, swelling or tenderness, except as  noted in detailed exam. ?Integumentary: No impressive skin lesions present, except as noted in detailed exam. ?Musculoskeletal: Unremarkable, except as noted in detailed exam. ? ?General: ?Well developed, well nourished 76 y.o. female in no apparent distress. Normal affect. Normal communication. Patient answers questions appropriately. The patient has a normal gait. There is no antalgic component. There is no hip lurch.  ? ?The patient does have full cervical flexion and extension without significant pain at today's visit. Limited left and right bend with rotation at today's appointment. She is nontender palpation directly over the vertebral bodies. Discomfort palpation along the left paravertebral musculature. ? ?Left Upper Extremity: ?Examination of the left shoulder and arm showed no bony abnormality or edema. The patient is able to actively forward flex 105 degrees. Active abduction 115 degrees. With the left arm abducted 90 degrees she can tolerate external rotation 85 degrees, internal rotation 60 degrees. She does have full passive range of motion with pain at the extremes with severe pain to the left arm at the extremes of both forward flexion and abduction. The patient has a positive Hawkins test and a positive impingement test. The patient has a negative drop arm test. The patient is moderately tender along the deltoid muscle. There is moderate subacromial space tenderness with no AC joint tenderness. Tenderness with palpation over the left proximal bicep tendon at today's visit. The patient has no instability of the shoulder with anterior-posterior motion. There is a negative sulcus sign. The rotator cuff muscle strength is 4/5 with supraspinatus, 4/5 with internal rotation, and 4/5 with external rotation. There is mild crepitus with range of motion activities.  ? ?Neurological: ?The patient has  sensation that is intact to light touch and pinprick bilaterally. The patient has normal grip strength. The  patient has full biceps, wrist extension, grip, and interosseous strength. The patient has 2 + DTRs bilaterally. ? ?Vascular: ?The patient has less than 2 second capillary refill. The patient has normal ulnar a

## 2021-07-02 MED ORDER — CHLORHEXIDINE GLUCONATE 0.12 % MT SOLN: 15.0000 mL | Freq: Once | OROMUCOSAL | Status: AC

## 2021-07-02 MED ORDER — LACTATED RINGERS IV SOLN: INTRAVENOUS | Status: DC

## 2021-07-02 MED ORDER — FAMOTIDINE 20 MG PO TABS: 20.0000 mg | ORAL_TABLET | Freq: Once | ORAL | Status: AC

## 2021-07-02 MED ORDER — ORAL CARE MOUTH RINSE
15.0000 mL | Freq: Once | OROMUCOSAL | Status: AC
Start: 2021-07-02 — End: 2021-07-03

## 2021-07-02 MED ORDER — CEFAZOLIN SODIUM-DEXTROSE 2-4 GM/100ML-% IV SOLN
2.0000 g | INTRAVENOUS | Status: AC
  Administered 2021-07-03: 2 g via INTRAVENOUS

## 2021-07-03 ENCOUNTER — Ambulatory Visit: Payer: Medicare HMO | Admitting: Anesthesiology

## 2021-07-03 ENCOUNTER — Ambulatory Visit
Admission: RE | Admit: 2021-07-03 | Discharge: 2021-07-03 | Disposition: A | Discharge status: Home / Self Care | Payer: Medicare HMO | Attending: Surgery | Admitting: Surgery

## 2021-07-03 ENCOUNTER — Other Ambulatory Visit: Payer: Self-pay

## 2021-07-03 ENCOUNTER — Encounter
Admission: RE | Disposition: A | Discharge status: Home / Self Care | Payer: Self-pay | Source: Home / Self Care | Attending: Surgery

## 2021-07-03 ENCOUNTER — Encounter: Payer: Self-pay | Admitting: Surgery

## 2021-07-03 ENCOUNTER — Ambulatory Visit: Payer: Medicare HMO

## 2021-07-03 DIAGNOSIS — F32A Depression, unspecified: Secondary | ICD-10-CM | POA: Diagnosis not present

## 2021-07-03 DIAGNOSIS — M25812 Other specified joint disorders, left shoulder: Secondary | ICD-10-CM | POA: Insufficient documentation

## 2021-07-03 DIAGNOSIS — K219 Gastro-esophageal reflux disease without esophagitis: Secondary | ICD-10-CM | POA: Insufficient documentation

## 2021-07-03 DIAGNOSIS — M75112 Incomplete rotator cuff tear or rupture of left shoulder, not specified as traumatic: Secondary | ICD-10-CM | POA: Insufficient documentation

## 2021-07-03 DIAGNOSIS — M7522 Bicipital tendinitis, left shoulder: Secondary | ICD-10-CM | POA: Diagnosis not present

## 2021-07-03 DIAGNOSIS — F419 Anxiety disorder, unspecified: Secondary | ICD-10-CM | POA: Diagnosis not present

## 2021-07-03 DIAGNOSIS — I10 Essential (primary) hypertension: Secondary | ICD-10-CM | POA: Diagnosis not present

## 2021-07-03 DIAGNOSIS — M1990 Unspecified osteoarthritis, unspecified site: Secondary | ICD-10-CM | POA: Insufficient documentation

## 2021-07-03 DIAGNOSIS — M19012 Primary osteoarthritis, left shoulder: Secondary | ICD-10-CM | POA: Diagnosis not present

## 2021-07-03 DIAGNOSIS — M25512 Pain in left shoulder: Secondary | ICD-10-CM | POA: Diagnosis present

## 2021-07-03 HISTORY — PX: SHOULDER ARTHROSCOPY WITH SUBACROMIAL DECOMPRESSION, ROTATOR CUFF REPAIR AND BICEP TENDON REPAIR: SHX5687

## 2021-07-03 SURGERY — SHOULDER ARTHROSCOPY WITH SUBACROMIAL DECOMPRESSION, ROTATOR CUFF REPAIR AND BICEP TENDON REPAIR
Anesthesia: General | Site: Shoulder | Laterality: Left

## 2021-07-03 MED ORDER — ONDANSETRON HCL 4 MG/2ML IJ SOLN: 4.0000 mg | Freq: Once | INTRAMUSCULAR | Status: DC | PRN

## 2021-07-03 MED ORDER — BUPIVACAINE LIPOSOME 1.3 % IJ SUSP
INTRAMUSCULAR | Status: DC | PRN
  Administered 2021-07-03: 10 mL via PERINEURAL

## 2021-07-03 MED ORDER — LACTATED RINGERS IR SOLN
Status: DC | PRN
  Administered 2021-07-03: 3001 mL

## 2021-07-03 MED ORDER — ACETAMINOPHEN 10 MG/ML IV SOLN: 1000.0000 mg | Freq: Once | INTRAVENOUS | Status: DC | PRN

## 2021-07-03 MED ORDER — BUPIVACAINE HCL (PF) 0.5 % IJ SOLN
INTRAMUSCULAR | Status: AC
  Filled 2021-07-03: qty 10

## 2021-07-03 MED ORDER — PROPOFOL 10 MG/ML IV BOLUS
INTRAVENOUS | Status: DC | PRN
Start: 2021-07-03 — End: 2021-07-03
  Administered 2021-07-03: 100 mg via INTRAVENOUS
  Administered 2021-07-03: 20 mg via INTRAVENOUS

## 2021-07-03 MED ORDER — FAMOTIDINE 20 MG PO TABS
ORAL_TABLET | ORAL | Status: AC
  Administered 2021-07-03: 20 mg via ORAL
  Filled 2021-07-03: qty 1

## 2021-07-03 MED ORDER — FENTANYL CITRATE (PF) 100 MCG/2ML IJ SOLN
INTRAMUSCULAR | Status: DC | PRN
  Administered 2021-07-03: 50 ug via INTRAVENOUS

## 2021-07-03 MED ORDER — MIDAZOLAM HCL 2 MG/2ML IJ SOLN
INTRAMUSCULAR | Status: AC
  Administered 2021-07-03: 1 mg via INTRAVENOUS
  Filled 2021-07-03: qty 2

## 2021-07-03 MED ORDER — ROCURONIUM BROMIDE 100 MG/10ML IV SOLN
INTRAVENOUS | Status: DC | PRN
  Administered 2021-07-03: 50 mg via INTRAVENOUS

## 2021-07-03 MED ORDER — OXYCODONE HCL 5 MG PO TABS
5.0000 mg | ORAL_TABLET | ORAL | 0 refills | Status: AC | PRN
Start: 2021-07-03 — End: ?

## 2021-07-03 MED ORDER — MIDAZOLAM HCL 2 MG/2ML IJ SOLN: 1.0000 mg | INTRAMUSCULAR | Status: DC | PRN

## 2021-07-03 MED ORDER — EPINEPHRINE PF 1 MG/ML IJ SOLN
INTRAMUSCULAR | Status: AC
  Filled 2021-07-03: qty 1

## 2021-07-03 MED ORDER — PHENYLEPHRINE HCL (PRESSORS) 10 MG/ML IV SOLN
INTRAVENOUS | Status: AC
  Filled 2021-07-03: qty 1

## 2021-07-03 MED ORDER — BUPIVACAINE-EPINEPHRINE 0.5% -1:200000 IJ SOLN
INTRAMUSCULAR | Status: DC | PRN
Start: 2021-07-03 — End: 2021-07-03
  Administered 2021-07-03: 30 mL

## 2021-07-03 MED ORDER — LIDOCAINE HCL (CARDIAC) PF 100 MG/5ML IV SOSY
PREFILLED_SYRINGE | INTRAVENOUS | Status: DC | PRN
  Administered 2021-07-03: 80 mg via INTRAVENOUS

## 2021-07-03 MED ORDER — FENTANYL CITRATE (PF) 100 MCG/2ML IJ SOLN
INTRAMUSCULAR | Status: AC
  Filled 2021-07-03: qty 2

## 2021-07-03 MED ORDER — BUPIVACAINE-EPINEPHRINE (PF) 0.5% -1:200000 IJ SOLN
INTRAMUSCULAR | Status: AC
  Filled 2021-07-03: qty 30

## 2021-07-03 MED ORDER — PHENYLEPHRINE 80 MCG/ML (10ML) SYRINGE FOR IV PUSH (FOR BLOOD PRESSURE SUPPORT)
PREFILLED_SYRINGE | INTRAVENOUS | Status: DC | PRN
  Administered 2021-07-03: 80 ug via INTRAVENOUS
  Administered 2021-07-03: 40 ug via INTRAVENOUS
  Administered 2021-07-03: 80 ug via INTRAVENOUS

## 2021-07-03 MED ORDER — ACETAMINOPHEN 10 MG/ML IV SOLN
INTRAVENOUS | Status: DC | PRN
  Administered 2021-07-03: 1000 mg via INTRAVENOUS

## 2021-07-03 MED ORDER — FENTANYL CITRATE PF 50 MCG/ML IJ SOSY: 50.0000 ug | PREFILLED_SYRINGE | Freq: Once | INTRAMUSCULAR | Status: AC

## 2021-07-03 MED ORDER — FENTANYL CITRATE (PF) 100 MCG/2ML IJ SOLN: 25.0000 ug | INTRAMUSCULAR | Status: DC | PRN

## 2021-07-03 MED ORDER — KETOROLAC TROMETHAMINE 15 MG/ML IJ SOLN
INTRAMUSCULAR | Status: AC
  Filled 2021-07-03: qty 1

## 2021-07-03 MED ORDER — BUPIVACAINE HCL (PF) 0.5 % IJ SOLN
INTRAMUSCULAR | Status: DC | PRN
  Administered 2021-07-03: 10 mL via PERINEURAL

## 2021-07-03 MED ORDER — LACTATED RINGERS IR SOLN
Status: DC | PRN
  Administered 2021-07-03: 3000 mL

## 2021-07-03 MED ORDER — CEFAZOLIN SODIUM-DEXTROSE 2-4 GM/100ML-% IV SOLN
INTRAVENOUS | Status: AC
  Filled 2021-07-03: qty 100

## 2021-07-03 MED ORDER — ONDANSETRON HCL 4 MG/2ML IJ SOLN
INTRAMUSCULAR | Status: DC | PRN
  Administered 2021-07-03: 4 mg via INTRAVENOUS

## 2021-07-03 MED ORDER — OXYCODONE HCL 5 MG/5ML PO SOLN: 5.0000 mg | Freq: Once | ORAL | Status: DC | PRN

## 2021-07-03 MED ORDER — ROCURONIUM BROMIDE 10 MG/ML (PF) SYRINGE
PREFILLED_SYRINGE | INTRAVENOUS | Status: AC
  Filled 2021-07-03: qty 10

## 2021-07-03 MED ORDER — ONDANSETRON HCL 4 MG/2ML IJ SOLN
INTRAMUSCULAR | Status: AC
  Filled 2021-07-03: qty 2

## 2021-07-03 MED ORDER — DEXAMETHASONE SODIUM PHOSPHATE 10 MG/ML IJ SOLN
INTRAMUSCULAR | Status: DC | PRN
  Administered 2021-07-03: 5 mg via INTRAVENOUS

## 2021-07-03 MED ORDER — ACETAMINOPHEN 10 MG/ML IV SOLN
INTRAVENOUS | Status: AC
  Filled 2021-07-03: qty 100

## 2021-07-03 MED ORDER — KETOROLAC TROMETHAMINE 15 MG/ML IJ SOLN
15.0000 mg | Freq: Once | INTRAMUSCULAR | Status: AC
Start: 2021-07-03 — End: 2021-07-03
  Administered 2021-07-03: 15 mg via INTRAVENOUS

## 2021-07-03 MED ORDER — SUGAMMADEX SODIUM 200 MG/2ML IV SOLN
INTRAVENOUS | Status: DC | PRN
Start: 2021-07-03 — End: 2021-07-03
  Administered 2021-07-03: 200 mg via INTRAVENOUS

## 2021-07-03 MED ORDER — DEXAMETHASONE SODIUM PHOSPHATE 10 MG/ML IJ SOLN
INTRAMUSCULAR | Status: AC
  Filled 2021-07-03: qty 1

## 2021-07-03 MED ORDER — FENTANYL CITRATE PF 50 MCG/ML IJ SOSY
PREFILLED_SYRINGE | INTRAMUSCULAR | Status: AC
  Administered 2021-07-03: 50 ug via INTRAVENOUS
  Filled 2021-07-03: qty 1

## 2021-07-03 MED ORDER — CHLORHEXIDINE GLUCONATE 0.12 % MT SOLN
OROMUCOSAL | Status: AC
  Administered 2021-07-03: 15 mL via OROMUCOSAL
  Filled 2021-07-03: qty 15

## 2021-07-03 MED ORDER — OXYCODONE HCL 5 MG PO TABS: 5.0000 mg | ORAL_TABLET | Freq: Once | ORAL | Status: DC | PRN

## 2021-07-03 MED ORDER — PHENYLEPHRINE HCL-NACL 20-0.9 MG/250ML-% IV SOLN
INTRAVENOUS | Status: DC | PRN
  Administered 2021-07-03: 25 ug/min via INTRAVENOUS

## 2021-07-03 MED ORDER — GLYCOPYRROLATE 0.2 MG/ML IJ SOLN
INTRAMUSCULAR | Status: DC | PRN
  Administered 2021-07-03: .2 mg via INTRAVENOUS

## 2021-07-03 MED ORDER — BUPIVACAINE LIPOSOME 1.3 % IJ SUSP
INTRAMUSCULAR | Status: AC
  Filled 2021-07-03: qty 10

## 2021-07-03 MED ORDER — LIDOCAINE HCL (PF) 2 % IJ SOLN
INTRAMUSCULAR | Status: AC
  Filled 2021-07-03: qty 5

## 2021-07-03 SURGICAL SUPPLY — 55 items
ANCH SUT 2 JK 1.5X2.9 2 LD (Anchor) ×1 IMPLANT
ANCH SUT 5.5 KNTLS (Anchor) ×1 IMPLANT
ANCH SUT Q-FX 2.8 (Anchor) ×2 IMPLANT
ANCHOR ALL-SUT Q-FIX 2.8 (Anchor) ×2 IMPLANT
ANCHOR HEALICOIL REGEN 5.5 (Anchor) ×1 IMPLANT
ANCHOR SUT JK SZ 2 2.9 DBL SL (Anchor) ×1 IMPLANT
APL PRP STRL LF DISP 70% ISPRP (MISCELLANEOUS) ×1
BIT DRILL JUGRKNT W/NDL BIT2.9 (DRILL) IMPLANT
BLADE FULL RADIUS 3.5 (BLADE) ×2 IMPLANT
BUR ACROMIONIZER 4.0 (BURR) ×2 IMPLANT
CANNULA SHAVER 8MMX76MM (CANNULA) ×2 IMPLANT
CHLORAPREP W/TINT 26 (MISCELLANEOUS) ×2 IMPLANT
COVER MAYO STAND REUSABLE (DRAPES) ×2 IMPLANT
DILATOR 5.5 THREADED HEALICOIL (MISCELLANEOUS) IMPLANT
DRILL JUGGERKNOT W/NDL BIT 2.9 (DRILL)
ELECT CAUTERY BLADE 6.4 (BLADE) ×2 IMPLANT
ELECT REM PT RETURN 9FT ADLT (ELECTROSURGICAL) ×2
ELECTRODE REM PT RTRN 9FT ADLT (ELECTROSURGICAL) ×1 IMPLANT
GAUZE SPONGE 4X4 12PLY STRL (GAUZE/BANDAGES/DRESSINGS) ×2 IMPLANT
GAUZE XEROFORM 1X8 LF (GAUZE/BANDAGES/DRESSINGS) ×2 IMPLANT
GLOVE BIOGEL PI IND STRL 8 (GLOVE) ×1 IMPLANT
GLOVE BIOGEL PI INDICATOR 8 (GLOVE) ×1
GLOVE SURG ENC MOIS LTX SZ7.5 (GLOVE) ×4 IMPLANT
GLOVE SURG ENC MOIS LTX SZ8 (GLOVE) ×4 IMPLANT
GLOVE SURG UNDER LTX SZ8 (GLOVE) ×2 IMPLANT
GOWN STRL REUS W/ TWL LRG LVL3 (GOWN DISPOSABLE) ×1 IMPLANT
GOWN STRL REUS W/ TWL XL LVL3 (GOWN DISPOSABLE) ×1 IMPLANT
GOWN STRL REUS W/TWL LRG LVL3 (GOWN DISPOSABLE) ×2
GOWN STRL REUS W/TWL XL LVL3 (GOWN DISPOSABLE) ×2
GRASPER SUT 15 45D LOW PRO (SUTURE) IMPLANT
IV LACTATED RINGER IRRG 3000ML (IV SOLUTION) ×4
IV LR IRRIG 3000ML ARTHROMATIC (IV SOLUTION) ×2 IMPLANT
KIT CANNULA 8X76-LX IN CANNULA (CANNULA) IMPLANT
KIT SUTURE 2.8 Q-FIX DISP (MISCELLANEOUS) ×1 IMPLANT
MANIFOLD NEPTUNE II (INSTRUMENTS) ×4 IMPLANT
MASK FACE SPIDER DISP (MASK) ×2 IMPLANT
MAT ABSORB  FLUID 56X50 GRAY (MISCELLANEOUS) ×1
MAT ABSORB FLUID 56X50 GRAY (MISCELLANEOUS) ×1 IMPLANT
PACK ARTHROSCOPY SHOULDER (MISCELLANEOUS) ×2 IMPLANT
PAD ABD DERMACEA PRESS 5X9 (GAUZE/BANDAGES/DRESSINGS) ×4 IMPLANT
PASSER SUT FIRSTPASS SELF (INSTRUMENTS) ×1 IMPLANT
SLING ARM LRG DEEP (SOFTGOODS) ×2 IMPLANT
SLING ULTRA II LG (MISCELLANEOUS) ×2 IMPLANT
SPONGE T-LAP 18X18 ~~LOC~~+RFID (SPONGE) ×2 IMPLANT
STAPLER SKIN PROX 35W (STAPLE) ×2 IMPLANT
STRAP SAFETY 5IN WIDE (MISCELLANEOUS) ×2 IMPLANT
SUT ETHIBOND 0 MO6 C/R (SUTURE) ×2 IMPLANT
SUT ULTRABRAID 2 COBRAID 38 (SUTURE) IMPLANT
SUT VIC AB 2-0 CT1 27 (SUTURE) ×4
SUT VIC AB 2-0 CT1 TAPERPNT 27 (SUTURE) ×2 IMPLANT
TAPE MICROFOAM 4IN (TAPE) ×2 IMPLANT
TUBING CONNECTING 10 (TUBING) ×2 IMPLANT
TUBING INFLOW SET DBFLO PUMP (TUBING) ×2 IMPLANT
WAND WEREWOLF FLOW 90D (MISCELLANEOUS) ×2 IMPLANT
WATER STERILE IRR 500ML POUR (IV SOLUTION) ×2 IMPLANT

## 2021-07-03 NOTE — Anesthesia Preprocedure Evaluation (Signed)
Anesthesia Evaluation  ?Patient identified by MRN, date of birth, ID band ?Patient awake ? ? ? ?Reviewed: ?Allergy & Precautions, H&P , NPO status , Patient's Chart, lab work & pertinent test results ? ?History of Anesthesia Complications ?Negative for: history of anesthetic complications ? ?Airway ?Mallampati: II ? ?TM Distance: >3 FB ?Neck ROM: Full ? ? ? Dental ? ?(+) Edentulous Upper, Edentulous Lower ?  ?Pulmonary ?neg pulmonary ROS, neg sleep apnea, neg COPD,  ?  ?breath sounds clear to auscultation ? ? ? ? ? ? Cardiovascular ?Exercise Tolerance: Poor ?hypertension, Pt. on medications ?(-) angina+ DOE  ?(-) Past MI and (-) Cardiac Stents (-) dysrhythmias  ?Rhythm:regular Rate:Normal ? ? ?  ?Neuro/Psych ?PSYCHIATRIC DISORDERS Anxiety Depression   ? GI/Hepatic ?Neg liver ROS, PUD, GERD  Medicated and Controlled,  ?Endo/Other  ?negative endocrine ROS ? Renal/GU ?  ? ?  ?Musculoskeletal ? ?(+) Arthritis ,  ? Abdominal ?  ?Peds ? 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    Comment:  rectal ?No date: Depression ?11/01/2013: Diverticulosis ?11/01/2013: Erosive gastritis ?No date: GERD (gastroesophageal reflux disease) ?No date: History of hiatal hernia ?No date: Hyperlipidemia ?No date: Hypertension ?10/2013: Rectal cancer ([place]) ? ?Past Surgical History: ?2011: ABDOMINAL HYSTERECTOMY ?2012: CHOLECYSTECTOMY ?No date: COLON SURGERY ?2015, 2016, 2018, 2021: COLONOSCOPY ?11/01/2013: ESOPHAGOGASTRODUODENOSCOPY ?05/08/2015: FLEXIBLE SIGMOIDOSCOPY ?No date: HERNIA REPAIR ?12/28/2013: PROCTECTOMY W/ CREATION OF COLON RESERVOIR ?    Comment:  Dr. Guy Begin at [facility]  ?11/09/2013: RECTAL ULTRASOUND ?No date: TONSILLECTOMY ?No date: TONSILLECTOMY AND ADENOIDECTOMY ?41/32/4401: UMBILICAL HERNIA REPAIR ? ?BMI   ? Body Mass Index: 31.52 kg/m?  ?   ? ? Reproductive/Obstetrics ?negative OB ROS ? ?  ? ? ? ? ? ? ? ? ? ? ? ? ? ?  ?  ? ? ? ? ? ? ? ? ?Anesthesia Physical ? ?Anesthesia Plan ? ?ASA: II ? ?Anesthesia Plan: General  ? ?Post-op Pain Management: GA combined w/ Regional for post-op pain and Regional block* and Ofirmev IV (intra-op)*  ? ?Induction: Intravenous ? ?PONV Risk Score and Plan: 3 and Ondansetron, Dexamethasone, Midazolam and Treatment may vary due to age or medical condition ? ?Airway Management Planned: Oral ETT ? ?Additional Equipment: None ? ?Intra-op Plan:  ? ?Post-operative Plan: Extubation in OR ? ?Informed Consent: I have reviewed the patients History and Physical, chart, labs and discussed the procedure including the risks, benefits and alternatives for the proposed anesthesia with the patient or authorized representative who has indicated his/her understanding and acceptance.  ? ? ? ?Dental Advisory Given ? ?Plan Discussed with: Anesthesiologist, CRNA and Surgeon ? ?Anesthesia Plan Comments: (95% spo2 on room air ?Discussed risks of anesthesia with patient, including PONV, sore throat, lip/dental/eye damage. Rare risks discussed as well, such as cardiorespiratory and neurological sequelae, and allergic reactions. Discussed the role of CRNA in patient's perioperative care. Patient understands. ?Discussed r/b/a of interscalene block, including elective nature. Risks discussed: ?- Rare: bleeding, infection, nerve damage ?- shortness of breath from hemidiaphragmatic paralysis ?- unilateral horner's syndrome ?- poor/non-working blocks ?- reactions and toxicity to local anesthetic ?Patient understands and agrees. ?)  ? ? ? ? ? ? ?Anesthesia Quick Evaluation ? ?

## 2021-07-03 NOTE — H&P (Signed)
History of Present Illness: ?Destiny Durham is a 76 y.o. female who presents today for repeat evaluation of ongoing left shoulder pain. The patient was initially evaluated for left shoulder pain ongoing for the previous 2 months in early March. The patient was seen where she was diagnosed with left rotator cuff tendinitis in addition to underlying biceps tendinopathy. The patient was offered and received a left subacromial steroid injection at this visit, she was also given a prescription of Celebrex to take for discomfort however the pain in her left shoulder has continued to worsen. The patient was eventually prescribed a low-dose hydrocodone to take as needed for pain, she was also ordered a MRI scan left shoulder and presents today to review the results. The patient continues to report increased pain in left shoulder especially trying to reach above her head or out to the side and she states that she is unable to reach above shoulder level at this time given the severity of her pain. The patient denies any recent trauma or injury affecting the left upper extremity since her last evaluation. She denies any numbness or tingling to the left arm at this time. She does report an occasional catching sensation in the left shoulder. The patient does have increased pain when attempting to sleep on the left side at night. ? ?Past Medical History: ? 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 Atypical chest pain (determined to be gallstones)  ? Claustrophobia  ? Diverticulosis 11/01/13  ? 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 GERD (gastroesophageal reflux disease)  ? HTN (hypertension)  ? Hyperlipidemia  ? Rectal cancer (CMS-HCC) 10/2013  ? Wears glasses  ? ?Past Surgical History: ? VAGINAL HYSTERECTOMY 2011 (partial)  ? CHOLECYSTECTOMY 2012 (gangrenous)  ? EGD 11/01/2013 (no repeat per [facility] patient notified of results in office 11/03/13)  ? COLONOSCOPY 11/01/2013 (repeat 1 year per [facility])  ? RECTAL EUS N/A 11/09/2013  ?Procedure:  RECTAL EUS; Surgeon: Vernie Ammons, MD; Location: [place]; Service: Gastroenterology; Laterality: N/A;  ? LAPAROSCOPIC PROCTECTOMY W/PULLTHROUGH/COLONIC RESERVOIR/ENTEROSTOMY N/A 12/28/2013  ?Procedure: LAPAROSCOPIC PROCTECTOMY W/PULLTHROUGH/COLONIC RESERVOIR/ENTEROSTOMY, , low anterior resection, ERAS+multimodal pain management; Surgeon: Guy Begin, MD; Location: [place]; Service: General Surgery; Laterality: N/A; ERAS+multimodal pain management  ? PROCTOSIGMOIDOSCOPY RIGID N/A 12/28/2013  ?Procedure: PROCTOSIGMOIDOSCOPY RIGID; Surgeon: Guy Begin, MD; Location: [place]; Service: General Surgery; Laterality: N/A;  ? REPAIR UMBILICAL HERNIA N/A [id number]/[id number]  ?Procedure: REPAIR UMBILICAL HERNIA, AGE 21 YEARS OR OLDER; REDUCIBLE; Surgeon: Guy Begin, MD; Location: [place]; Service: General Surgery; Laterality: N/A;  ? COLONOSCOPY W/BIOPSY N/A 09/07/2014  ?Procedure: SURVEILLANCE COLONOSCOPY; Surgeon: Weldon Inches, MD; Location: [facility]; Service: Gastroenterology; Laterality: N/A;  ? SIGMOIDOSCOPY FLEXIBLE W/BIOPSY N/A 05/08/2015  ?Procedure: SIGMOIDOSCOPY, FLEXIBLE; WITH BIOPSY, SINGLE OR MULTIPLE; Surgeon: Weldon Inches, MD; Location: [facility]; Service: Gastroenterology; Laterality: N/A;  ? COLONOSCOPY N/A 02/20/2017  ?Procedure: COLORECTAL CANCER SCREENING; COLONOSCOPY ON INDIVIDUAL AT HIGH RISK; Surgeon: Erenest Rasher, MD; Location: [facility]; Service: Gastroenterology; Laterality: N/A;  ? COLONOSCOPY W/REMOVAL LESIONS BY SNARE (08/19/2019)  ?Procedure: SCREENING; COLONOSCOPY; Surgeon: Erenest Rasher, MD; Location: [facility]; Service: Gastroenterology; Laterality: N/A;  ? Limited arthroscopic debridement, arthroscopic subacromial decompression, mini-open rotator cuff repair supplemented with a Smith & Nephew Regeneten patch, and mini-open biceps tenodesis,  right shoulder. Right 02/08/2020 (Dr. Roland Rack)  ? ?Past Family History: ? Kidney cancer Sister  ? Kidney disease Brother  ? Colon cancer Neg Hx  ?  Colon polyps Neg Hx  ? Liver disease Neg Hx  ? Rectal cancer Neg Hx  ? Ulcers Neg Hx  ? Anesthesia problems Neg Hx  ? Malignant hyperthermia Neg Hx  ? ?Medications: ? celecoxib (CELEBREX) 200 MG capsule Take 1 capsule (200 mg total) by mouth 2 (two) times daily 60 capsule 0  ? diazePAM (VALIUM) 10 MG tablet Take 10 mg by mouth as needed  ? HYDROcodone-acetaminophen (NORCO) 5-325 mg tablet Take 1 tablet by mouth every 8 (eight) hours as needed for Pain 15 tablet 0  ? ibuprofen (MOTRIN) 200 MG tablet Take 200 mg by mouth every 6 (six) hours as needed for Pain  ? lisinopril-hydrochlorothiazide (PRINZIDE,ZESTORETIC) 20-12.5 mg tablet Take 1 tablet by mouth every morning.  ? multivit with min-folic acid 761 mcg Chew once daily  ? pantoprazole (PROTONIX) 20 MG DR tablet Take 20 mg by mouth once daily  ? pravastatin (PRAVACHOL) 20 MG tablet Take 20 mg by mouth every morning.  ? sertraline (ZOLOFT) 50 MG tablet Take 50 mg by mouth every evening  ? ?Allergies: ? Latex Rash  ? Penicillins Hives  ? ?Review of Systems:  ?A comprehensive 14 point ROS was performed, reviewed by me today, and the pertinent orthopaedic findings are documented in the HPI. ? ?Physical Exam: ?BP 130/82  Ht 160 cm ('5\' 3"'$ )  Wt 81.9 kg (180 lb 9.6 oz)  BMI 31.99 kg/m?  ?General/Constitutional: The patient appears to be well-nourished, well-developed, and in no acute distress. ?Neuro/Psych: Normal mood and affect, oriented to person, place and time. ?Eyes: Non-icteric. Pupils are equal, round, and reactive to light, and exhibit synchronous movement. ?ENT: Unremarkable. ?Lymphatic: No palpable adenopathy. ?Respiratory: Lungs clear to auscultation, Normal chest excursion, No wheezes and Non-labored breathing ?Cardiovascular: Regular rate and rhythm. No murmurs. and No edema, swelling or tenderness, except as  noted in detailed exam. ?Integumentary: No impressive skin lesions present, except as noted in detailed exam. ?Musculoskeletal: Unremarkable, except as noted in detailed exam. ? ?General: ?Well developed, well nourished 76 y.o. female in no apparent distress. Normal affect. Normal communication. Patient answers questions appropriately. The patient has a normal gait. There is no antalgic component. There is no hip lurch.  ? ?The patient does have full cervical flexion and extension without significant pain at today's visit. Limited left and right bend with rotation at today's appointment. She is nontender palpation directly over the vertebral bodies. Discomfort palpation along the left paravertebral musculature. ? ?Left Upper Extremity: ?Examination of the left shoulder and arm showed no bony abnormality or edema. The patient is able to actively forward flex 105 degrees. Active abduction 115 degrees. With the left arm abducted 90 degrees she can tolerate external rotation 85 degrees, internal rotation 60 degrees. She does have full passive range of motion with pain at the extremes with severe pain to the left arm at the extremes of both forward flexion and abduction. The patient has a positive Hawkins test and a positive impingement test. The patient has a negative drop arm test. The patient is moderately tender along the deltoid muscle. There is moderate subacromial space tenderness with no AC joint tenderness. Tenderness with palpation over the left proximal bicep tendon at today's visit. The patient has no instability of the shoulder with anterior-posterior motion. There is a negative sulcus sign. The rotator cuff muscle strength is 4/5 with supraspinatus, 4/5 with internal rotation, and 4/5 with external rotation. There is mild crepitus with range of motion activities.  ? ?Neurological: ?The patient has  sensation that is intact to light touch and pinprick bilaterally. The patient has normal grip strength. The  patient has full biceps, wrist extension, grip, and interosseous strength. The patient has 2 + DTRs bilaterally. ? ?Vascular: ?The patient has less than 2 second capillary refill. The patient has normal ulnar a

## 2021-07-03 NOTE — Transfer of Care (Signed)
Immediate Anesthesia Transfer of Care Note ? ?Patient: Destiny Durham ? ?Procedure(s) Performed: SHOULDER ARTHROSCOPY WITH SUBACROMIAL DECOMPRESSION, ROTATOR CUFF REPAIR AND BICEPS TENODESIS. (Left: Shoulder) ? ?Patient Location: PACU ? ?Anesthesia Type:GA combined with regional for post-op pain ? ?Level of Consciousness: drowsy ? ?Airway & Oxygen Therapy: Patient Spontanous Breathing and Patient connected to face mask oxygen ? ?Post-op Assessment: Report given to RN and Post -op Vital signs reviewed and stable ? ?Post vital signs: Reviewed and stable ? ?Last Vitals:  ?Vitals Value Taken Time  ?BP 117/47 07/03/21 1219  ?Temp    ?Pulse 60 07/03/21 1223  ?Resp 23 07/03/21 1223  ?SpO2 99 % 07/03/21 1223  ?Vitals shown include unvalidated device data. ? ?Last Pain:  ?Vitals:  ? 07/03/21 0822  ?TempSrc: Temporal  ?PainSc: 3   ?   ? ?  ? ?Complications: No notable events documented. ?

## 2021-07-03 NOTE — Anesthesia Procedure Notes (Signed)
Procedure Name: Intubation ?Date/Time: 07/03/2021 10:13 AM ?Performed by: Loletha Grayer, CRNA ?Pre-anesthesia Checklist: Patient identified, Patient being monitored, Timeout performed, Emergency Drugs available and Suction available ?Patient Re-evaluated:Patient Re-evaluated prior to induction ?Oxygen Delivery Method: Circle system utilized ?Preoxygenation: Pre-oxygenation with 100% oxygen ?Induction Type: IV induction ?Ventilation: Mask ventilation without difficulty and Oral airway inserted - appropriate to patient size ?Laryngoscope Size: 3 and McGraph ?Grade View: Grade I ?Tube type: Oral ?Tube size: 7.0 mm ?Number of attempts: 1 ?Airway Equipment and Method: Stylet ?Placement Confirmation: ETT inserted through vocal cords under direct vision, positive ETCO2 and breath sounds checked- equal and bilateral ?Secured at: 21 cm ?Tube secured with: Tape ?Dental Injury: Teeth and Oropharynx as per pre-operative assessment  ? ? ? ? ?

## 2021-07-03 NOTE — Anesthesia Procedure Notes (Signed)
Anesthesia Regional Block: Interscalene brachial plexus block   Pre-Anesthetic Checklist: , timeout performed,  Correct Patient, Correct Site, Correct Laterality,  Correct Procedure, Correct Position, site marked,  Risks and benefits discussed,  Surgical consent,  Pre-op evaluation,  At surgeon's request and post-op pain management  Laterality: Left  Prep: chloraprep       Needles:  Injection technique: Single-shot  Needle Type: Echogenic Needle     Needle Length: 4cm  Needle Gauge: 25     Additional Needles:   Procedures:,,,, ultrasound used (permanent image in chart),,    Narrative:  Injection made incrementally with aspirations every 5 mL.  Performed by: Personally  Anesthesiologist: Romolo Sieling, MD  Additional Notes: Patient's chart reviewed and they were deemed appropriate candidate for procedure, at surgeon's request. Patient educated about risks, benefits, and alternatives of the block including but not limited to: temporary or permanent nerve damage, bleeding, infection, damage to surround tissues, pneumothorax, hemidiaphragmatic paralysis, unilateral Horner's syndrome, block failure, local anesthetic toxicity. Patient expressed understanding. A formal time-out was conducted consistent with institution rules.  Monitors were applied, and minimal sedation used (see nursing record). The site was prepped with skin prep and allowed to dry, and sterile gloves were used. A high frequency linear ultrasound probe with probe cover was utilized throughout. C5-7 nerve roots located and appeared anatomically normal, local anesthetic injected around them, and echogenic block needle trajectory was monitored throughout. Aspiration performed every 5ml. Lung and blood vessels were avoided. All injections were performed without resistance and free of blood and paresthesias. The patient tolerated the procedure well.  Injectate: 10ml exparel + 10ml 0.5% bupivacaine      

## 2021-07-03 NOTE — Op Note (Signed)
07/03/2021 ? ?11:49 AM ? ?Patient:   Destiny Durham ? ?Pre-Op Diagnosis:   Impingement/tendinopathy with partial-thickness rotator cuff tear and biceps tendinopathy, left shoulder. ? ?Post-Op Diagnosis:   Impingement/tendinopathy with near full-thickness rotator cuff tear, adhesive capsulitis, and biceps tendinopathy, left shoulder. ? ?Procedure:   Manipulation under anesthesia, extensive arthroscopic debridement, arthroscopic subacromial decompression, mini-open rotator cuff repair, and mini-open biceps tenodesis, left shoulder. ? ?Anesthesia:   General endotracheal with interscalene block using Exparel placed preoperatively by the anesthesiologist. ? ?Surgeon:   Pascal Lux, MD ? ?Assistant:   Cameron Proud, PA-C; Myra Rude, PA-S ? ?Findings:   As above. Prior to manipulation, the left shoulder could be forward flexed to 155? and abducted to 150?Marland Kitchen At 90? of abduction, the shoulder could be externally rotated to 75? and internally rotated to 50?Marland Kitchen Following manipulation, the shoulder could be forward flexed to 165?, abducted to 160? and, at 90? of abduction, externally rotated to 90? and internally rotated to 60?. ? ?There was extensive synovitis within the joint, as well as mild fraying of the labrum anteriorly and superiorly. There was a near full-thickness tear involving the anterior insertional fibers of the supraspinatus tendon measuring approximately 1 x 1.2 cm. There also was an area of articular sided partial-thickness tearing involving the superior insertional fibers of the subscapularis tendon with involvement of less than 20% of the footprint.  The remainder of the rotator cuff was in satisfactory condition. There was moderate tendinopathy of the biceps tendon without partial or full-thickness tearing. The articular surfaces of the glenoid and humerus both were in satisfactory condition. ? ?Complications:   None ? ?Fluids:   60 cc ? ?Estimated blood loss:   10 cc ? ?Tourniquet time:    None ? ?Drains:   None ? ?Closure:   Staples     ? ?Brief clinical note:   The patient is a 76 year old female with a long history of gradually worsening left shoulder pain. The patient's symptoms have progressed despite medications, activity modification, etc. The patient's history and examination are consistent with impingement/tendinopathy with a probable rotator cuff tear. These findings were confirmed by MRI scan. The patient presents at this time for definitive management of these shoulder symptoms. ? ?Procedure:   The patient underwent placement of an interscalene block using Exparel by the anesthesiologist in the preoperative holding area before being brought into the operating room and lain in the supine position. The patient then underwent general endotracheal intubation and anesthesia before being repositioned in the beach chair position using the beach chair positioner. The left shoulder and upper extremity were prepped with ChloraPrep solution before being draped sterilely. Preoperative antibiotics were administered. A timeout was performed to confirm the proper surgical site before the expected portal sites and incision site were injected with 0.5% Sensorcaine with epinephrine. The left shoulder was gently manipulated in both abduction and external rotation, as well as adduction and internal rotation. Several palpable and audible pops were heard as the scar tissue released, permitting full range of motion of the shoulder.  ? ?A posterior portal was created and the glenohumeral joint thoroughly inspected with the findings as described above. An anterior portal was created using an outside-in technique. The labrum and rotator cuff were further probed, again confirming the above-noted findings.  The full-radius resector was introduced and used to debride abundant synovial tissue as well as the hematoma created by the manipulation under anesthesia. It also was used to debride the torn margin of the  supraspinatus tendon  tear, as well as areas of labral fraying anteriorly and superiorly. The ArthroCare wand was inserted and used to release the biceps tendon from its labral anchor. In addition, it was used to debride the rotator interval to reduce the likelihood of recurrent adhesive capsulitis. Finally, the ArthroCare wand was used to obtain hemostasis as well as to "anneal" the labrum superiorly and anteriorly. The instruments were removed from the joint after suctioning the excess fluid. ? ?The camera was repositioned through the posterior portal into the subacromial space. A separate lateral portal was created using an outside-in technique. The 3.5 mm full-radius resector was introduced and used to perform a subtotal bursectomy. The ArthroCare wand was then inserted and used to remove the periosteal tissue off the undersurface of the anterior third of the acromion as well as to recess the coracoacromial ligament from its attachment along the anterior and lateral margins of the acromion. The 4.0 mm acromionizing bur was introduced and used to complete the decompression by removing the undersurface of the anterior third of the acromion. The full radius resector was reintroduced to remove any residual bony debris before the ArthroCare wand was reintroduced to obtain hemostasis. The instruments were then removed from the subacromial space after suctioning the excess fluid. ? ?An approximately 4-5 cm incision was made over the anterolateral aspect of the shoulder beginning at the anterolateral corner of the acromion and extending distally in line with the bicipital groove. This incision was carried down through the subcutaneous tissues to expose the deltoid fascia. The raphae between the anterior and middle thirds was identified and this plane developed to provide access into the subacromial space. Additional bursal tissues were debrided sharply using Metzenbaum scissors. The rotator cuff tear was readily  identified. The margins were debrided sharply with a #15 blade and the exposed greater tuberosity roughened with a rongeur. The tear was repaired using two Smith & Nephew 2.8 mm Q-Fix anchors. These sutures were then brought back laterally and secured using a single Gould knotless RegeneSorb anchor to create a two-layer closure. An apparent watertight closure was obtained. ? ?The bicipital groove was identified by palpation and opened for 1-1.5 cm. The biceps tendon stump was retrieved through this defect. The floor of the bicipital groove was roughened with a curet before a single Biomet 2.9 mm JuggerKnot anchor was inserted. Both sets of sutures were passed through the biceps tendon and tied securely to effect the tenodesis. The bicipital sheath was reapproximated using two #0 Ethibond interrupted sutures, incorporating the biceps tendon to further reinforce the tenodesis. ? ?The wound was copiously irrigated with sterile saline solution before the deltoid raphae was reapproximated using 2-0 Vicryl interrupted sutures. The subcutaneous tissues were closed in two layers using 2-0 Vicryl interrupted sutures before the skin was closed using staples. The portal sites also were closed using staples. A sterile bulky dressing was applied to the shoulder before the arm was placed into a shoulder immobilizer. The patient was then awakened, extubated, and returned to the recovery room in satisfactory condition after tolerating the procedure well. ?

## 2021-07-03 NOTE — Discharge Instructions (Addendum)
Orthopedic discharge instructions: ?Keep dressing dry and intact.  ?May shower after dressing changed on post-op day #4 (Saturday).  ?Cover staples with Band-Aids after drying off. ?Apply ice frequently to shoulder. ?Take Celebrex 200 mg OR Aleve 1-2 tabs BID with meals for 5-7 days, then as necessary. ?Take oxycodone as prescribed when needed.  ?May supplement with ES Tylenol if necessary. ?Keep shoulder immobilizer on at all times except may remove for bathing purposes. ?Follow-up in 10-14 days or as scheduled. ? ?AMBULATORY SURGERY  ?DISCHARGE INSTRUCTIONS ? ? ?The drugs that you were given will stay in your system until tomorrow so for the next 24 hours you should not: ? ?Drive an automobile ?Make any legal decisions ?Drink any alcoholic beverage ? ? ?You may resume regular meals tomorrow.  Today it is better to start with liquids and gradually work up to solid foods. ? ?You may eat anything you prefer, but it is better to start with liquids, then soup and crackers, and gradually work up to solid foods. ? ? ?Please notify your doctor immediately if you have any unusual bleeding, trouble breathing, redness and pain at the surgery site, drainage, fever, or pain not relieved by medication. ? ? ? ?Additional Instructions: ? ?Leave green arm band on for 4 days. ? ? ?Please contact your physician with any problems or Same Day Surgery at [phone], Monday through Friday 6 am to 4 pm, or [place] at [facility] number at [phone].  ? ?   Interscalene Nerve Block with Exparel ? ? For your surgery you have received an Interscalene Nerve Block with Exparel. ?Nerve Blocks affect many types of nerves, including nerves that control movement, pain and normal sensation.  You may experience feelings such as numbness, tingling, heaviness, weakness or the inability to move your arm or the feeling or sensation that your arm has "fallen asleep". ?A nerve block with Exparel can last up to 5 days.  Usually the weakness  wears off first.  The tingling and heaviness usually wear off next.  Finally you may start to notice pain.  Keep in mind that this may occur in any order.  Once a nerve block starts to wear off it is usually completely gone within 60 minutes. ?ISNB may cause mild shortness of breath, a hoarse voice, blurry vision, unequal pupils, or drooping of the face on the same side as the nerve block.  These symptoms will usually resolve with the numbness.  Very rarely the procedure itself can cause mild seizures. ?If needed, your surgeon will give you a prescription for pain medication.  It will take about 60 minutes for the oral pain medication to become fully effective.  So, it is recommended that you start taking this medication before the nerve block first begins to wear off, or when you first begin to feel discomfort. ?Take your pain medication only as prescribed.  Pain medication can cause sedation and decrease your breathing if you take more than you need for the level of pain that you have. ?Nausea is a common side effect of many pain medications.  You may want to eat something before taking your pain medicine to prevent nausea. ?After an Interscalene nerve block, you cannot feel pain, pressure or extremes in temperature in the effected arm.  Because your arm is numb it is at an increased risk for injury.  To decrease the possibility of injury, please practice the following: ? ?While you are awake change the position of your arm frequently to prevent  too much pressure on any one area for prolonged periods of time. ? If you have a cast or tight dressing, check the color or your fingers every couple of hours.  Call your surgeon with the appearance of any discoloration (white or blue). ?If you are given a sling to wear before you go home, please wear it  at all times until the block has completely worn off.  Do not get up at night without your sling. ?Please contact [facility] or your surgeon if you do not begin to  regain sensation after 7 days from the surgery.  Anesthesia may be contacted by calling the Same Day Surgery Department, Mon. through Fri., 6 am to 4 pm at [phone].   ?If you experience any other problems or concerns, please contact your surgeon's office. ?If you experience severe or prolonged shortness of breath go to the nearest emergency department.  ?SHOULDER SLING IMMOBILIZER  ? ?VIDEO ?Slingshot 2 Shoulder Brace Application - YouTube ---[email or web address] ? ?INSTRUCTIONS ?While supporting the injured arm, slide the forearm into the sling. Wrap the adjustable shoulder strap around the neck and shoulders and attach the strap end to the sling using  the ?alligator strap tab.?  ?Adjust the shoulder strap to the required length. ?Position the shoulder pad behind the neck. ?To secure the shoulder pad location (optional), pull the shoulder strap away from the shoulder pad, unfold the hook material on the top of the pad, then press the shoulder strap back onto the hook material to secure the pad in place. ?Attach the closure strap across the open top of the sling. Position the strap so that it holds the arm securely in the sling. Next, attach the thumb strap to the open end of the sling between the thumb and fingers. ?After sling has been fit, it may be easily removed and reapplied using the quick release buckle on shoulder strap. ?If a neutral pillow or 15? abduction pillow is included, place the pillow at the waistline. Attach the sling to the pillow, lining up hook material on the pillow with the loop on sling. Adjust the waist strap to fit.  ?If waist strap is too long, cut it to fit. Use the small piece of double sided hook material (located on top of the pillow) to secure the strap end. Place the double sided hook material on the inside of the cut strap end and secure it to the waist strap.     ?If no pillow is included, attach the waist strap to the sling and adjust to fit.    ? ?Washing Instructions: Straps and sling must be removed and cleaned regularly depending on your activity level and perspiration. Hand wash straps and sling in cold water with mild detergent, rinse, air dry ? ? ?  ?

## 2021-07-03 NOTE — Anesthesia Postprocedure Evaluation (Signed)
Anesthesia Post Note ? ?Patient: Jaci Desanto ? ?Procedure(s) Performed: SHOULDER ARTHROSCOPY WITH SUBACROMIAL DECOMPRESSION, ROTATOR CUFF REPAIR AND BICEPS TENODESIS. (Left: Shoulder) ? ?Patient location during evaluation: PACU ?Anesthesia Type: General ?Level of consciousness: awake and alert ?Pain management: pain level controlled ?Vital Signs Assessment: post-procedure vital signs reviewed and stable ?Respiratory status: spontaneous breathing, nonlabored ventilation, respiratory function stable and patient connected to nasal cannula oxygen ?Cardiovascular status: blood pressure returned to baseline and stable ?Postop Assessment: no apparent nausea or vomiting ?Anesthetic complications: no ? ? ?No notable events documented. ? ? ?Last Vitals:  ?Vitals:  ? 07/03/21 1300 07/03/21 1316  ?BP: (!) 128/55 (!) 129/50  ?Pulse: (!) 52 (!) 50  ?Resp: 16 16  ?Temp: (!) 36.1 ?C (!) 36.1 ?C  ?SpO2: 94% 94%  ?  ?Last Pain:  ?Vitals:  ? 07/03/21 1316  ?TempSrc: Temporal  ?PainSc: 2   ? ? ?  ?  ?  ?  ?  ?  ? ?Martha Clan ? ? ? ? ?

## 2021-07-04 ENCOUNTER — Encounter: Payer: Self-pay | Admitting: Surgery

## 2021-09-04 ENCOUNTER — Other Ambulatory Visit: Payer: Self-pay | Admitting: Family Medicine

## 2021-09-04 DIAGNOSIS — M79662 Pain in left lower leg: Secondary | ICD-10-CM

## 2021-09-10 ENCOUNTER — Ambulatory Visit
Admission: RE | Admit: 2021-09-10 | Discharge: 2021-09-10 | Disposition: A | Discharge status: Home / Self Care | Payer: Medicare HMO | Source: Ambulatory Visit | Attending: Family Medicine | Admitting: Family Medicine

## 2021-09-10 DIAGNOSIS — M79662 Pain in left lower leg: Secondary | ICD-10-CM | POA: Insufficient documentation

## 2021-09-13 ENCOUNTER — Other Ambulatory Visit: Payer: Self-pay | Admitting: Family Medicine

## 2021-09-13 DIAGNOSIS — Z1231 Encounter for screening mammogram for malignant neoplasm of breast: Secondary | ICD-10-CM

## 2021-10-04 ENCOUNTER — Ambulatory Visit
Admission: RE | Admit: 2021-10-04 | Discharge: 2021-10-04 | Disposition: A | Discharge status: Home / Self Care | Payer: Medicare HMO | Source: Ambulatory Visit | Attending: Family Medicine | Admitting: Family Medicine

## 2021-10-04 DIAGNOSIS — Z1231 Encounter for screening mammogram for malignant neoplasm of breast: Secondary | ICD-10-CM | POA: Insufficient documentation

## 2022-01-07 ENCOUNTER — Encounter (INDEPENDENT_AMBULATORY_CARE_PROVIDER_SITE_OTHER): Payer: Self-pay

## 2022-08-05 IMAGING — MR MR SHOULDER*L* W/O CM
4 of 5 series · 31 of 40 positions shown · non-contrast
Comparison: None.

CLINICAL DATA: Left shoulder pain for 4 months. Limited range of
motion. Pain in mild movement at the proximal humerus.

EXAM:
MRI OF THE LEFT SHOULDER WITHOUT CONTRAST
TECHNIQUE: Multiplanar, multisequence MR imaging of the shoulder was performed.
No intravenous contrast was administered.

[Series 5: T2 fat-sat · axial · left · 4.0mm · 0.44mm/px · z∈[-87,+23]mm · 8 of 26 slices shown (1 of 3)]
[im 1/26]
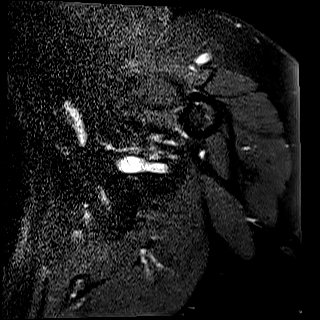
[im 4/26]
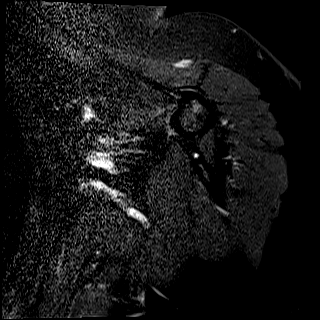
[im 8/26]
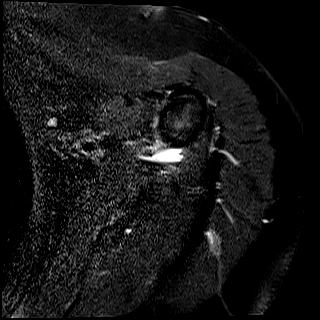
[im 11/26]
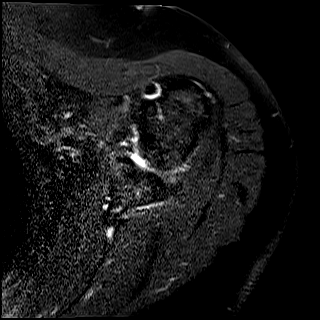
[im 15/26]
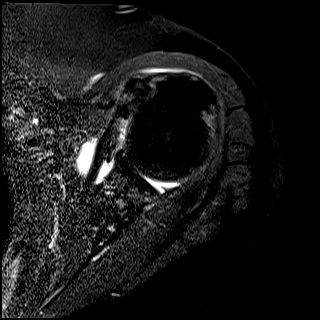
[im 18/26]
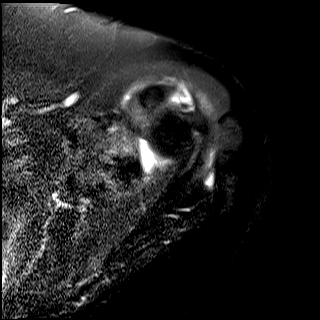
[im 22/26]
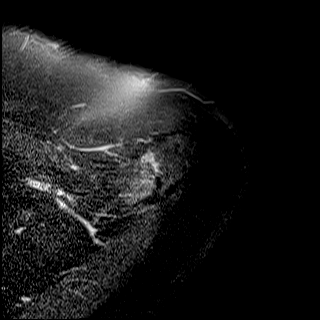
[im 26/26]
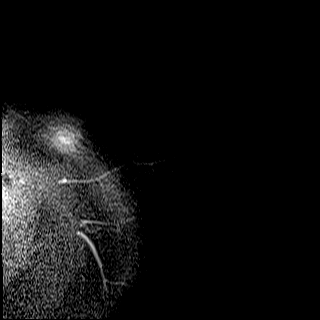

[Series 7: T2 fat-sat · oblique · left · 4.0mm · 0.44mm/px · 9 of 26 slices shown (2 of 3)]
[im 1/26]
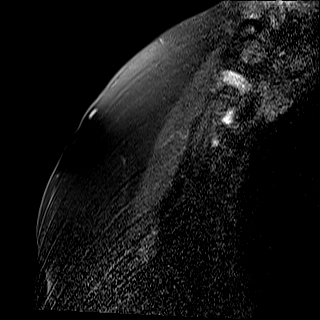
[im 4/26]
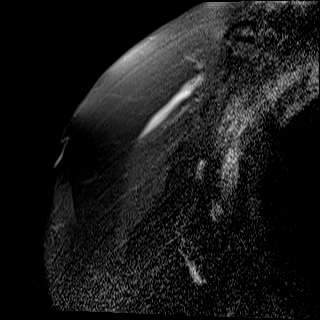
[im 7/26]
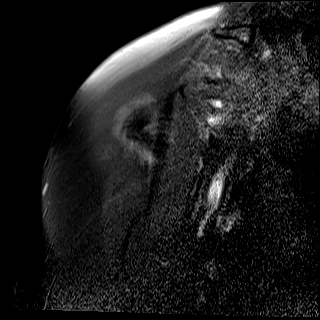
[im 10/26]
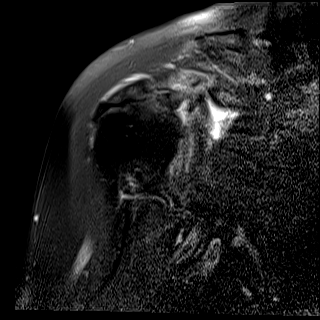
[im 13/26]
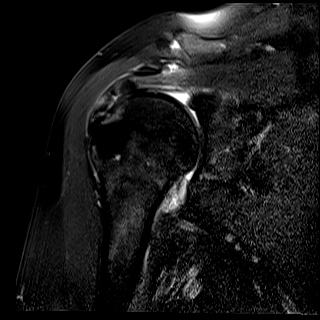
[im 16/26]
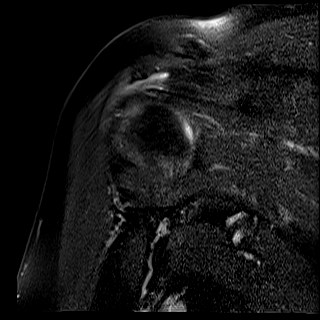
[im 19/26]
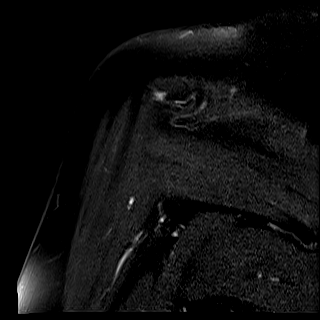
[im 22/26]
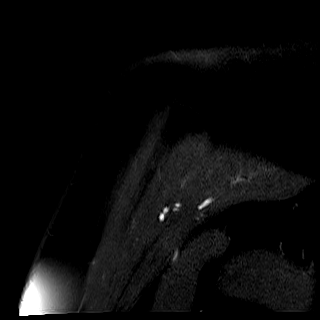
[im 26/26]
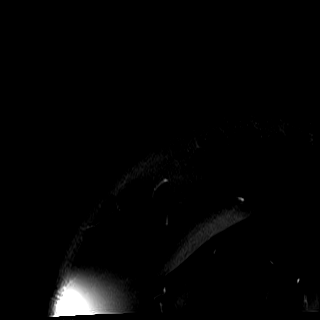

[Series 8: PD · oblique · left · 4.0mm · 0.44mm/px · 9 of 26 slices shown]
[im 1/26]
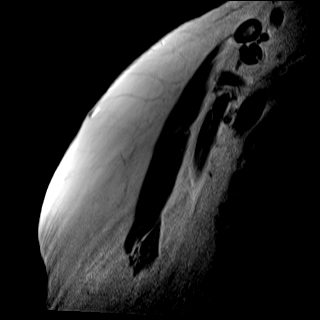
[im 4/26]
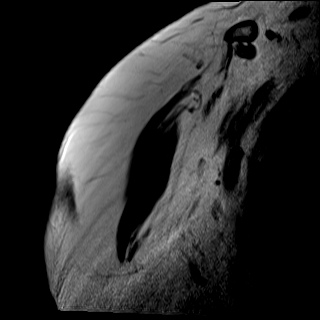
[im 7/26]
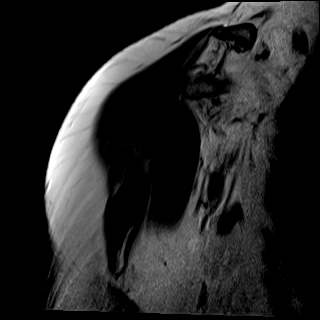
[im 10/26]
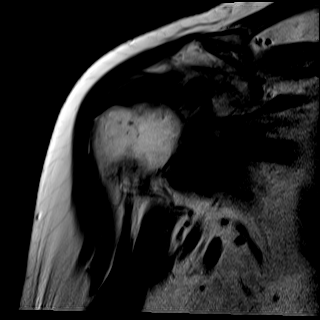
[im 13/26]
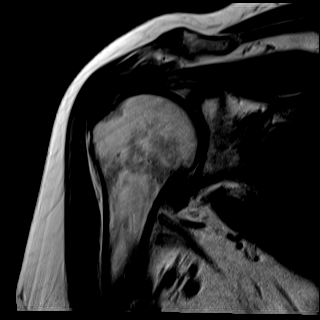
[im 16/26]
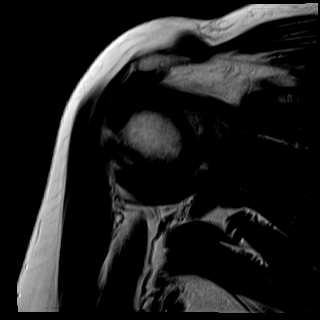
[im 19/26]
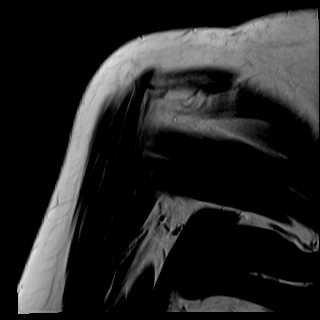
[im 22/26]
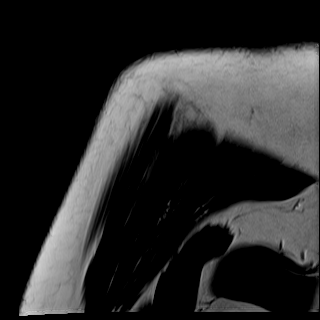
[im 26/26]
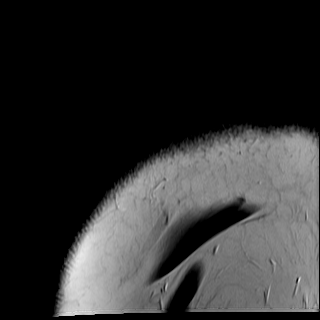

[Series 9: T2 fat-sat · oblique · left · 4.0mm · 0.22mm/px · 5 of 22 slices shown (3 of 3)]
[im 1/22]
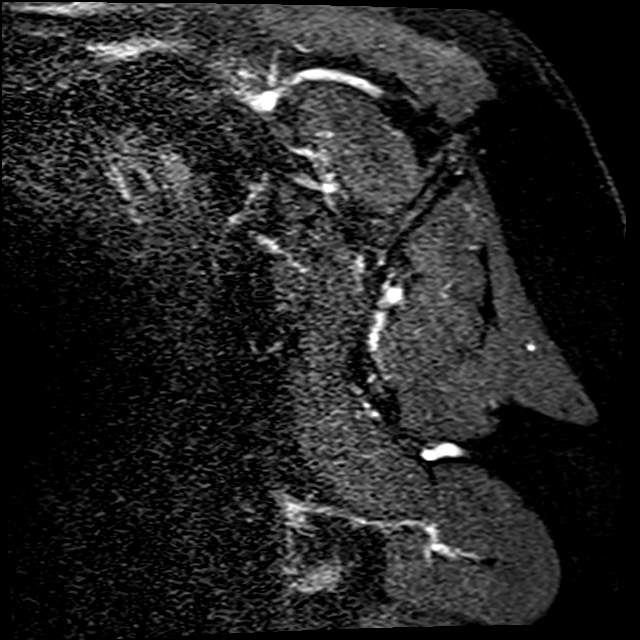
[im 4/22]
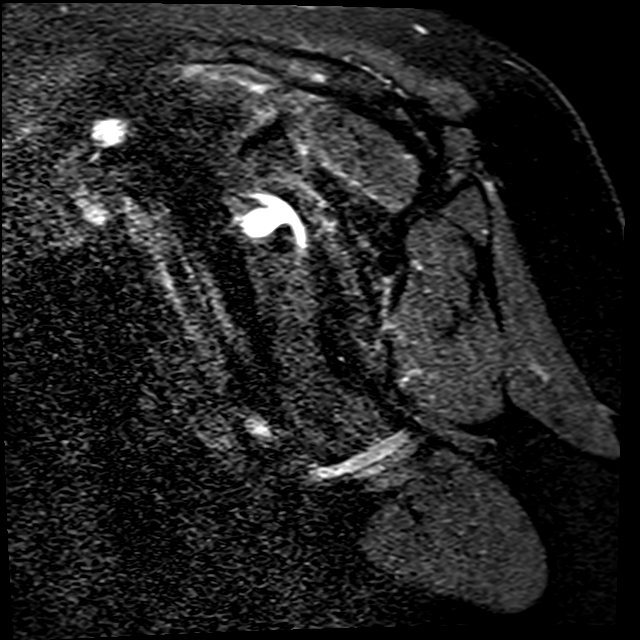
[im 8/22]
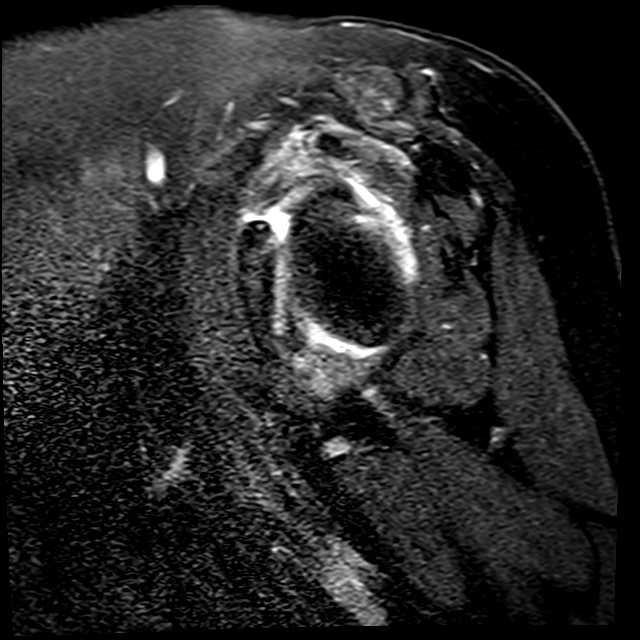
[im 11/22]
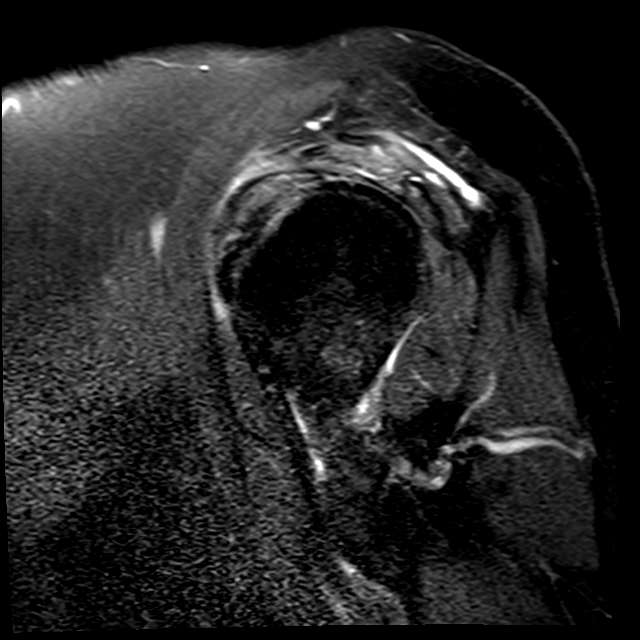
[im 18/22]
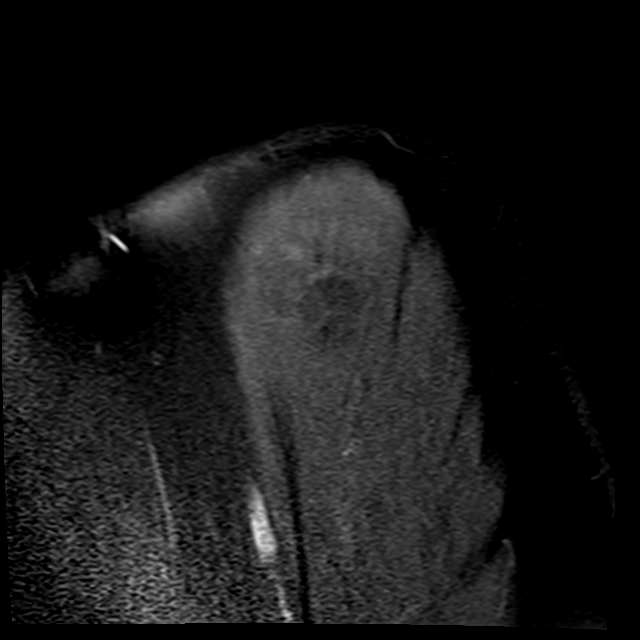

[31 of 40 positions shown; findings below may reference images not displayed]

FINDINGS: Rotator cuff: There is a fluid bright articular sided tear of the
mid AP dimension of the supraspinatus tendon footprint measuring up
to 12 mm in AP dimension (sagittal image 8) and involving the 25% of
the articular side of the tendon footprint with up to 9 mm tendon
retraction (coronal images 11 through 13). Mild-to-moderate
underlying supraspinatus tendinosis. Mild anterior infraspinatus
tendinosis. Tiny anterior superior subscapularis tendon tear (axial
images 11-13), allowing the long head of the biceps tendon to be
minimally perched on the anterior superior aspect of the lesser
tuberosity as the biceps tendon enters the bicipital groove. The
teres minor is intact.

Muscles:  Mild-to-moderate supraspinatus muscle atrophy.

Biceps long head: Moderate intermediate T2 signal and mild
thickening of the long head of the biceps tendon as it courses over
the lesser tuberosity into the bicipital groove, tendinosis with
partial-thickness tearing (axial images 12 through 16). Similar
likely partial-thickness tearing of the more proximal tendon.

Acromioclavicular Joint: There are mild degenerative changes of the
acromioclavicular joint including joint space narrowing, subchondral
marrow edema, and peripheral osteophytosis. Type 1 acromion. Mild
subacromial/subdeltoid bursitis.

Glenohumeral Joint: Mild cartilage thinning.

Labrum: Mild attenuation and degenerative irregularity of the
posterior and superior aspects of the glenoid labrum.

Bones:  No acute fracture.

Other: None.
IMPRESSION: :
IMPRESSION: 1. Partial-thickness articular sided tear of the mid AP dimension of
the supraspinatus measuring up to 12 mm in AP dimension.
2. Tiny partial-thickness tear of the anterior superior aspect of
the subscapularis tendon, allowing the long head of the biceps
tendon to be minimally perched on the anterior superior aspect of
the lesser tuberosity as the biceps tendon enters the bicipital
groove.
3. Mild-to-moderate tendinosis and partial-thickness tearing of the
long head of the biceps tendon as it courses over the lesser
tuberosity into the bicipital groove.
4. Mild degenerative changes of the acromioclavicular joint.

## 2022-09-13 ENCOUNTER — Other Ambulatory Visit: Payer: Self-pay | Admitting: Family Medicine

## 2022-09-13 DIAGNOSIS — Z1231 Encounter for screening mammogram for malignant neoplasm of breast: Secondary | ICD-10-CM

## 2022-10-08 ENCOUNTER — Ambulatory Visit
Admission: RE | Admit: 2022-10-08 | Discharge: 2022-10-08 | Disposition: A | Discharge status: Home / Self Care | Payer: Medicare HMO | Source: Ambulatory Visit | Attending: Family Medicine | Admitting: Family Medicine

## 2022-10-08 DIAGNOSIS — Z1231 Encounter for screening mammogram for malignant neoplasm of breast: Secondary | ICD-10-CM | POA: Insufficient documentation

## 2023-04-28 ENCOUNTER — Other Ambulatory Visit: Payer: Self-pay

## 2023-04-28 ENCOUNTER — Ambulatory Visit: Payer: 59 | Admitting: Anesthesiology

## 2023-04-28 ENCOUNTER — Encounter: Payer: Self-pay | Admitting: Anesthesiology

## 2023-04-28 ENCOUNTER — Encounter
Admission: RE | Disposition: A | Discharge status: Home / Self Care | Payer: Self-pay | Source: Home / Self Care | Attending: Gastroenterology

## 2023-04-28 ENCOUNTER — Ambulatory Visit
Admission: RE | Admit: 2023-04-28 | Discharge: 2023-04-28 | Disposition: A | Discharge status: Home / Self Care | Payer: 59 | Attending: Gastroenterology | Admitting: Gastroenterology

## 2023-04-28 DIAGNOSIS — Z1211 Encounter for screening for malignant neoplasm of colon: Secondary | ICD-10-CM | POA: Diagnosis present

## 2023-04-28 DIAGNOSIS — Z9049 Acquired absence of other specified parts of digestive tract: Secondary | ICD-10-CM | POA: Insufficient documentation

## 2023-04-28 DIAGNOSIS — K6389 Other specified diseases of intestine: Secondary | ICD-10-CM | POA: Diagnosis not present

## 2023-04-28 DIAGNOSIS — I10 Essential (primary) hypertension: Secondary | ICD-10-CM | POA: Diagnosis not present

## 2023-04-28 DIAGNOSIS — E785 Hyperlipidemia, unspecified: Secondary | ICD-10-CM | POA: Insufficient documentation

## 2023-04-28 DIAGNOSIS — K219 Gastro-esophageal reflux disease without esophagitis: Secondary | ICD-10-CM | POA: Diagnosis not present

## 2023-04-28 DIAGNOSIS — F32A Depression, unspecified: Secondary | ICD-10-CM | POA: Diagnosis not present

## 2023-04-28 DIAGNOSIS — K5730 Diverticulosis of large intestine without perforation or abscess without bleeding: Secondary | ICD-10-CM | POA: Diagnosis not present

## 2023-04-28 DIAGNOSIS — Z980 Intestinal bypass and anastomosis status: Secondary | ICD-10-CM | POA: Diagnosis not present

## 2023-04-28 DIAGNOSIS — Z79899 Other long term (current) drug therapy: Secondary | ICD-10-CM | POA: Diagnosis not present

## 2023-04-28 DIAGNOSIS — F419 Anxiety disorder, unspecified: Secondary | ICD-10-CM | POA: Insufficient documentation

## 2023-04-28 DIAGNOSIS — Z85048 Personal history of other malignant neoplasm of rectum, rectosigmoid junction, and anus: Secondary | ICD-10-CM | POA: Diagnosis not present

## 2023-04-28 DIAGNOSIS — Z8711 Personal history of peptic ulcer disease: Secondary | ICD-10-CM | POA: Diagnosis not present

## 2023-04-28 DIAGNOSIS — K449 Diaphragmatic hernia without obstruction or gangrene: Secondary | ICD-10-CM | POA: Insufficient documentation

## 2023-04-28 DIAGNOSIS — K640 First degree hemorrhoids: Secondary | ICD-10-CM | POA: Diagnosis not present

## 2023-04-28 HISTORY — PX: COLONOSCOPY WITH PROPOFOL: SHX5780

## 2023-04-28 SURGERY — COLONOSCOPY WITH PROPOFOL
Anesthesia: General

## 2023-04-28 MED ORDER — PROPOFOL 1000 MG/100ML IV EMUL
INTRAVENOUS | Status: AC
  Filled 2023-04-28: qty 100

## 2023-04-28 MED ORDER — PROPOFOL 10 MG/ML IV BOLUS
INTRAVENOUS | Status: DC | PRN
  Administered 2023-04-28: 50 mg via INTRAVENOUS

## 2023-04-28 MED ORDER — PROPOFOL 500 MG/50ML IV EMUL
INTRAVENOUS | Status: DC | PRN
  Administered 2023-04-28: 150 ug/kg/min via INTRAVENOUS

## 2023-04-28 MED ORDER — SODIUM CHLORIDE 0.9 % IV SOLN: INTRAVENOUS | Status: DC

## 2023-04-28 NOTE — Transfer of Care (Signed)
 Immediate Anesthesia Transfer of Care Note  Patient: Destiny Durham  Procedure(s) Performed: COLONOSCOPY WITH PROPOFOL  Patient Location: PACU  Anesthesia Type:General  Level of Consciousness: awake and drowsy  Airway & Oxygen Therapy: Patient Spontanous Breathing and Patient connected to face mask oxygen  Post-op Assessment: Report given to RN and Post -op Vital signs reviewed and stable  Post vital signs: Reviewed and stable  Last Vitals:  Vitals Value Taken Time  BP    Temp    Pulse    Resp    SpO2      Last Pain:  Vitals:   04/28/23 1203  TempSrc: Temporal  PainSc: 0-No pain         Complications: There were no known notable events for this encounter.

## 2023-04-28 NOTE — Anesthesia Postprocedure Evaluation (Signed)
 Anesthesia Post Note  Patient: Destiny Durham  Procedure(s) Performed: COLONOSCOPY WITH PROPOFOL  Patient location during evaluation: Endoscopy Anesthesia Type: General Level of consciousness: awake and alert Pain management: pain level controlled Vital Signs Assessment: post-procedure vital signs reviewed and stable Respiratory status: spontaneous breathing, nonlabored ventilation, respiratory function stable and patient connected to nasal cannula oxygen Cardiovascular status: blood pressure returned to baseline and stable Postop Assessment: no apparent nausea or vomiting Anesthetic complications: no   There were no known notable events for this encounter.   Last Vitals:  Vitals:   04/28/23 1252 04/28/23 1302  BP: (!) 108/39 (!) 104/45  Pulse: 61 64  Resp: 16 19  Temp: (!) 36.2 C   SpO2: 94% 97%    Last Pain:  Vitals:   04/28/23 1302  TempSrc:   PainSc: 0-No pain                 Louie Boston

## 2023-04-28 NOTE — Anesthesia Preprocedure Evaluation (Signed)
 Anesthesia Evaluation  Patient identified by MRN, date of birth, ID band Patient awake    Reviewed: Allergy & Precautions, NPO status , Patient's Chart, lab work & pertinent test results  History of Anesthesia Complications Negative for: history of anesthetic complications  Airway Mallampati: III  TM Distance: >3 FB Neck ROM: full    Dental no notable dental hx.    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) No date: Anxiety No date: Arthritis     Comment:  knee and shoulder No date: Cancer (HCC)     Comment:  rectal No date: Claustrophobia No date: Depression 11/01/2013: Diverticulosis No date: Diverticulosis 11/01/2013: Erosive gastritis No date: GERD (gastroesophageal reflux disease) No date: History of hiatal hernia No date: Hyperlipidemia No date: Hypertension 10/2013: Rectal cancer [facility])  Past Surgical History: 2011: ABDOMINAL HYSTERECTOMY 2012: CHOLECYSTECTOMY No date: COLON SURGERY 2015, 2016, 2018, 2021: COLONOSCOPY 11/01/2013: ESOPHAGOGASTRODUODENOSCOPY 05/08/2015: FLEXIBLE SIGMOIDOSCOPY No date: HERNIA REPAIR 12/28/2013: PROCTECTOMY W/ CREATION OF COLON RESERVOIR     Comment:  Dr. Donnalee Curry at [facility]  11/09/2013: RECTAL ULTRASOUND 02/08/2020: SHOULDER ARTHROSCOPY WITH SUBACROMIAL DECOMPRESSION AND  OPEN ROTATOR C; Right     Comment:  Procedure: SHOULDER  ARTHROSCOPY WITH DEBRIDEMENT,               DECOMPRESSION, AND ROTATOR CUFF REPAIR, AND BICEP TENDON               REPAIR;  Surgeon: Christena Flake, MD;  Location: [facility] ORS;              Service: Orthopedics;  Laterality: Right; 07/03/2021: SHOULDER ARTHROSCOPY WITH SUBACROMIAL DECOMPRESSION,  ROTATOR CUFF REPAIR AND BICEP TENDON REPAIR; Left     Comment:  Procedure: SHOULDER ARTHROSCOPY WITH SUBACROMIAL               DECOMPRESSION, ROTATOR CUFF REPAIR AND BICEPS TENODESIS.;              Surgeon: Christena Flake, MD;  Location: [facility] ORS;                Service: Orthopedics;  Laterality: Left;  With block No date: TONSILLECTOMY No date: TONSILLECTOMY AND ADENOIDECTOMY 12/28/2013: UMBILICAL HERNIA REPAIR  BMI    Body Mass Index: 32.42 kg/m      Reproductive/Obstetrics negative OB ROS                             Anesthesia Physical Anesthesia Plan  ASA: 3  Anesthesia Plan: General   Post-op Pain Management: Minimal or no pain anticipated   Induction: Intravenous  PONV Risk Score and Plan: 2 and Propofol infusion and TIVA  Airway Management Planned: Natural Airway and Nasal Cannula  Additional Equipment:   Intra-op Plan:   Post-operative Plan:   Informed Consent: I have reviewed the patients History and Physical, chart, labs and discussed the procedure including the risks, benefits and alternatives  for the proposed anesthesia with the patient or authorized representative who has indicated his/her understanding and acceptance.     Dental Advisory Given  Plan Discussed with: Anesthesiologist, CRNA and Surgeon  Anesthesia Plan Comments: (Patient consented for risks of anesthesia including but not limited to:  - adverse reactions to medications - risk of airway placement if required - damage to eyes, teeth, lips or other oral mucosa - nerve damage due to positioning  - sore throat or hoarseness - Damage to heart, brain, nerves, lungs, other parts  of body or loss of life  Patient voiced understanding and assent.)       Anesthesia Quick Evaluation

## 2023-04-28 NOTE — Interval H&P Note (Signed)
 History and Physical Interval Note:  04/28/2023 12:30 PM  Destiny Durham  has presented today for surgery, with the diagnosis of history rectal cancer.  The various methods of treatment have been discussed with the patient and family. After consideration of risks, benefits and other options for treatment, the patient has consented to  Procedure(s): COLONOSCOPY WITH PROPOFOL (N/A) as a surgical intervention.  The patient's history has been reviewed, patient examined, no change in status, stable for surgery.  I have reviewed the patient's chart and labs.  Questions were answered to the patient's satisfaction.     Regis Bill  Ok to proceed with colonoscopy

## 2023-04-28 NOTE — Op Note (Signed)
 [facility]astroenterology Patient Name: Destiny Durham Procedure Date: 04/28/2023 12:12 PM MRN: [id number] Account #: [id number] Date of Birth: 10/22/1945 Admit Type: Outpatient Age: 78 Room: [facility] ENDO ROOM 3 Gender: Female Note Status: Finalized Instrument Name: Nelda Marseille [id number] Procedure:             Colonoscopy Indications:           High risk colon cancer surveillance: Personal history                         of colon cancer Providers:             Eather Colas MD, MD Medicines:             Monitored Anesthesia Care Complications:         No immediate complications. Estimated blood loss:                         Minimal. Procedure:             Pre-Anesthesia Assessment:                        - Prior to the procedure, a History and Physical was                         performed, and patient medications and allergies were                         reviewed. The patient is competent. The risks and                         benefits of the procedure and the sedation options and                         risks were discussed with the patient. All questions                         were answered and informed consent was obtained.                         Patient identification and proposed procedure were                         verified by the physician, the nurse, the                         anesthesiologist, the anesthetist and the technician                         in the endoscopy suite. Mental Status Examination:                         alert and oriented. Airway Examination: normal                         oropharyngeal airway and neck mobility. Respiratory                         Examination: clear to auscultation. CV Examination:  normal. Prophylactic Antibiotics: The patient does not                         require prophylactic antibiotics. Prior                         Anticoagulants: The patient has taken no anticoagulant                          or antiplatelet agents. ASA Grade Assessment: III - A                         patient with severe systemic disease. After reviewing                         the risks and benefits, the patient was deemed in                         satisfactory condition to undergo the procedure. The                         anesthesia plan was to use monitored anesthesia care                         (MAC). Immediately prior to administration of                         medications, the patient was re-assessed for adequacy                         to receive sedatives. The heart rate, respiratory                         rate, oxygen saturations, blood pressure, adequacy of                         pulmonary ventilation, and response to care were                         monitored throughout the procedure. The physical                         status of the patient was re-assessed after the                         procedure.                        After obtaining informed consent, the colonoscope was                         passed under direct vision. Throughout the procedure,                         the patient's blood pressure, pulse, and oxygen                         saturations were monitored continuously. The  Colonoscope was introduced through the anus and                         advanced to the the cecum, identified by appendiceal                         orifice and ileocecal valve. The colonoscopy was                         performed without difficulty. The patient tolerated                         the procedure well. The quality of the bowel                         preparation was good. The ileocecal valve, appendiceal                         orifice, and rectum were photographed. Findings:      The perianal and digital rectal examinations were normal.      An area of moderately congested mucosa was found in the transverse       colon. Biopsies were taken with a cold  forceps for histology. Estimated       blood loss was minimal. Area was tattooed with an injection of 0.3 mL of       [place] ink.      Multiple small-mouthed diverticula were found in the sigmoid colon,       descending colon and transverse colon.      There was evidence of a prior end-to-end colo-colonic anastomosis in the       rectum. This was patent and was characterized by healthy appearing       mucosa.      Internal hemorrhoids were found during endoscopy. The hemorrhoids were       Grade I (internal hemorrhoids that do not prolapse).      Retroflexion in the rectum was not performed due to post-surgical       anatomy. Impression:            - Congested mucosa in the transverse colon. Biopsied.                         Tattooed.                        - Diverticulosis in the sigmoid colon, in the                         descending colon and in the transverse colon.                        - Patent end-to-end colo-colonic anastomosis,                         characterized by healthy appearing mucosa.                        - Internal hemorrhoids. Recommendation:        - Discharge patient to home.                        -  Resume previous diet.                        - Continue present medications.                        - Await pathology results.                        - Return to referring physician as previously                         scheduled. Procedure Code(s):     --- Professional ---                        [phone], Colonoscopy, flexible; with biopsy, single or                         multiple                        45381, Colonoscopy, flexible; with directed submucosal                         injection(s), any substance Diagnosis Code(s):     --- Professional ---                        J81.191, Personal history of other malignant neoplasm                         of large intestine                        K63.89, Other specified diseases of intestine                         K64.0, First degree hemorrhoids                        Z98.0, Intestinal bypass and anastomosis status                        K57.30, Diverticulosis of large intestine without                         perforation or abscess without bleeding CPT copyright 2022 American Medical Association. All rights reserved. The codes documented in this report are preliminary and upon coder review may  be revised to meet current compliance requirements. Eather Colas MD, MD 04/28/2023 1:00:04 PM Number of Addenda: 0 Note Initiated On: 04/28/2023 12:12 PM Scope Withdrawal Time: 0 hours 10 minutes 42 seconds  Total Procedure Duration: 0 hours 14 minutes 4 seconds  Estimated Blood Loss:  Estimated blood loss was minimal.      [facility]

## 2023-04-28 NOTE — H&P (Signed)
 Outpatient short stay form Pre-procedure 04/28/2023  Regis Bill, MD  Primary Physician: Dortha Kern, MD  Reason for visit:  Surveillance  History of present illness:    78 y/o lady with history of rectal cancer, hypertension, and hyperlipidemia here for surveillance colonoscopy. Last colonoscopy per chart was in 2021. No blood thinners. Son passed away from esophageal cancer. History of proctocolectomy, hysterectomy, and cholecystectomy.    Current Facility-Administered Medications:    0.9 %  sodium chloride infusion, , Intravenous, Continuous, Ione Sandus[place], Rossie Muskrat, MD, Last Rate: 20 mL/hr at 04/28/23 1216, Continued from Pre-op at 04/28/23 1216  Medications Prior to Admission  Medication Sig Dispense Refill Last Dose/Taking   celecoxib (CELEBREX) 200 MG capsule Take 200 mg by mouth 2 (two) times daily.   04/28/2023 at  7:00 AM   diazepam (VALIUM) 10 MG tablet Take 10 mg by mouth every 12 (twelve) hours as needed for anxiety.   04/27/2023   lisinopril-hydrochlorothiazide (ZESTORETIC) 20-12.5 MG tablet Take 1 tablet by mouth daily.    04/28/2023 at  7:00 AM   Multiple Vitamins-Minerals (ONE-A-DAY VITACRAVES) CHEW Chew 2 tablets by mouth daily.    Past Week   naproxen sodium (ALEVE) 220 MG tablet Take 220 mg by mouth daily.   Past Week   pantoprazole (PROTONIX) 20 MG tablet Take 20 mg by mouth daily.    04/28/2023 at  7:00 AM   sertraline (ZOLOFT) 50 MG tablet Take 50 mg by mouth at bedtime.    04/28/2023 at  7:00 AM   oxyCODONE (ROXICODONE) 5 MG immediate release tablet Take 1-2 tablets (5-10 mg total) by mouth every 4 (four) hours as needed for moderate pain or severe pain. 40 tablet 0    pravastatin (PRAVACHOL) 20 MG tablet Take 20 mg by mouth daily.         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    Physical Exam  Gen: Alert, oriented. Appears stated age.  HEENT: PERRLA. Lungs: No respiratory distress CV: RRR Abd: soft, benign, no masses Ext: No edema    Planned procedures: Proceed with colonoscopy. The patient understands the nature of the planned procedure, indications, risks, alternatives and potential complications including but not limited to bleeding, infection, perforation, damage to internal organs and possible oversedation/side effects from anesthesia. The patient agrees and gives consent to proceed.  Please refer to procedure notes for findings, recommendations and patient disposition/instructions.     Regis Bill, MD [facility]

## 2023-04-29 LAB — SURGICAL PATHOLOGY

## 2023-05-12 ENCOUNTER — Ambulatory Visit: Admit: 2023-05-12 | Payer: Medicare HMO

## 2023-05-12 SURGERY — COLONOSCOPY WITH PROPOFOL
Anesthesia: General

## 2023-05-14 ENCOUNTER — Encounter: Payer: Self-pay | Admitting: Gastroenterology

## 2023-10-01 ENCOUNTER — Other Ambulatory Visit: Payer: Self-pay | Admitting: Family Medicine

## 2023-10-01 DIAGNOSIS — Z1231 Encounter for screening mammogram for malignant neoplasm of breast: Secondary | ICD-10-CM

## 2023-10-16 ENCOUNTER — Ambulatory Visit
Admission: RE | Admit: 2023-10-16 | Discharge: 2023-10-16 | Disposition: A | Discharge status: Home / Self Care | Source: Ambulatory Visit | Attending: Family Medicine | Admitting: Family Medicine

## 2023-10-16 DIAGNOSIS — Z1231 Encounter for screening mammogram for malignant neoplasm of breast: Secondary | ICD-10-CM | POA: Insufficient documentation

## 2024-04-27 ENCOUNTER — Ambulatory Visit: Admit: 2024-04-27
